# Patient Record
Sex: Female | Born: 1950 | Race: Black or African American | Hispanic: No | Smoking: Current every day smoker
Health system: Southern US, Community
[De-identification: ages and names within clinical notes are randomized; demographics above are authoritative.]

## PROBLEM LIST (undated history)

## (undated) DIAGNOSIS — I10 Essential (primary) hypertension: Secondary | ICD-10-CM

## (undated) DIAGNOSIS — Z8709 Personal history of other diseases of the respiratory system: Secondary | ICD-10-CM

## (undated) DIAGNOSIS — K219 Gastro-esophageal reflux disease without esophagitis: Secondary | ICD-10-CM

## (undated) DIAGNOSIS — Z860100 Personal history of colon polyps, unspecified: Secondary | ICD-10-CM

## (undated) DIAGNOSIS — M542 Cervicalgia: Secondary | ICD-10-CM

## (undated) DIAGNOSIS — Z87448 Personal history of other diseases of urinary system: Secondary | ICD-10-CM

## (undated) DIAGNOSIS — M1990 Unspecified osteoarthritis, unspecified site: Secondary | ICD-10-CM

## (undated) DIAGNOSIS — F419 Anxiety disorder, unspecified: Secondary | ICD-10-CM

## (undated) DIAGNOSIS — R519 Headache, unspecified: Secondary | ICD-10-CM

## (undated) DIAGNOSIS — R350 Frequency of micturition: Secondary | ICD-10-CM

## (undated) DIAGNOSIS — K5790 Diverticulosis of intestine, part unspecified, without perforation or abscess without bleeding: Secondary | ICD-10-CM

## (undated) DIAGNOSIS — Z923 Personal history of irradiation: Secondary | ICD-10-CM

## (undated) DIAGNOSIS — C50919 Malignant neoplasm of unspecified site of unspecified female breast: Secondary | ICD-10-CM

## (undated) DIAGNOSIS — R51 Headache: Secondary | ICD-10-CM

## (undated) HISTORY — PX: OTHER SURGICAL HISTORY: SHX169

## (undated) HISTORY — DX: Unspecified osteoarthritis, unspecified site: M19.90

## (undated) HISTORY — PX: APPENDECTOMY: SHX54

## (undated) HISTORY — PX: TONSILLECTOMY: SUR1361

## (undated) HISTORY — PX: ABDOMINAL HYSTERECTOMY: SHX81

## (undated) HISTORY — DX: Malignant neoplasm of unspecified site of unspecified female breast: C50.919

## (undated) HISTORY — DX: Anxiety disorder, unspecified: F41.9

## (undated) HISTORY — PX: BACK SURGERY: SHX140

## (undated) HISTORY — DX: Essential (primary) hypertension: I10

---
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
## 1999-06-18 ENCOUNTER — Other Ambulatory Visit: Admission: RE | Admit: 1999-06-18 | Discharge: 1999-06-18 | Payer: Self-pay | Admitting: Obstetrics & Gynecology

## 2000-07-21 ENCOUNTER — Other Ambulatory Visit: Admission: RE | Admit: 2000-07-21 | Discharge: 2000-07-21 | Payer: Self-pay | Admitting: Obstetrics & Gynecology

## 2001-09-14 ENCOUNTER — Other Ambulatory Visit: Admission: RE | Admit: 2001-09-14 | Discharge: 2001-09-14 | Payer: Self-pay | Admitting: Obstetrics & Gynecology

## 2002-09-27 ENCOUNTER — Other Ambulatory Visit: Admission: RE | Admit: 2002-09-27 | Discharge: 2002-09-27 | Payer: Self-pay | Admitting: Obstetrics & Gynecology

## 2003-10-19 ENCOUNTER — Other Ambulatory Visit: Admission: RE | Admit: 2003-10-19 | Discharge: 2003-10-19 | Payer: Self-pay | Admitting: Obstetrics & Gynecology

## 2004-10-27 ENCOUNTER — Other Ambulatory Visit: Admission: RE | Admit: 2004-10-27 | Discharge: 2004-10-27 | Payer: Self-pay | Admitting: Obstetrics & Gynecology

## 2009-02-27 ENCOUNTER — Encounter (INDEPENDENT_AMBULATORY_CARE_PROVIDER_SITE_OTHER): Payer: Self-pay | Admitting: *Deleted

## 2009-02-27 ENCOUNTER — Ambulatory Visit (HOSPITAL_BASED_OUTPATIENT_CLINIC_OR_DEPARTMENT_OTHER): Admission: RE | Admit: 2009-02-27 | Discharge: 2009-02-27 | Payer: Self-pay | Admitting: *Deleted

## 2010-07-23 LAB — POCT I-STAT, CHEM 8
BUN: 18 mg/dL (ref 6–23)
Chloride: 105 mEq/L (ref 96–112)
Creatinine, Ser: 1 mg/dL (ref 0.4–1.2)
Glucose, Bld: 76 mg/dL (ref 70–99)
Potassium: 4.2 mEq/L (ref 3.5–5.1)

## 2011-01-06 ENCOUNTER — Emergency Department (HOSPITAL_COMMUNITY)
Admission: EM | Admit: 2011-01-06 | Discharge: 2011-01-06 | Disposition: A | Discharge status: Home / Self Care | Payer: BC Managed Care – PPO | Attending: Emergency Medicine | Admitting: Emergency Medicine

## 2011-01-06 ENCOUNTER — Emergency Department (HOSPITAL_COMMUNITY): Payer: BC Managed Care – PPO

## 2011-01-06 DIAGNOSIS — I10 Essential (primary) hypertension: Secondary | ICD-10-CM | POA: Insufficient documentation

## 2011-01-06 DIAGNOSIS — M542 Cervicalgia: Secondary | ICD-10-CM | POA: Insufficient documentation

## 2011-01-06 DIAGNOSIS — Z79899 Other long term (current) drug therapy: Secondary | ICD-10-CM | POA: Insufficient documentation

## 2011-01-06 DIAGNOSIS — R51 Headache: Secondary | ICD-10-CM | POA: Insufficient documentation

## 2011-01-06 DIAGNOSIS — R5381 Other malaise: Secondary | ICD-10-CM | POA: Insufficient documentation

## 2011-01-06 DIAGNOSIS — R209 Unspecified disturbances of skin sensation: Secondary | ICD-10-CM | POA: Insufficient documentation

## 2011-01-06 LAB — COMPREHENSIVE METABOLIC PANEL
ALT: 15 U/L (ref 0–35)
AST: 19 U/L (ref 0–37)
Albumin: 4.1 g/dL (ref 3.5–5.2)
Alkaline Phosphatase: 47 U/L (ref 39–117)
BUN: 11 mg/dL (ref 6–23)
BUN: 10 mg/dL (ref 6–23)
CO2: 32 mEq/L (ref 19–32)
CO2: 31 mEq/L (ref 19–32)
Calcium: 9.4 mg/dL (ref 8.4–10.5)
Chloride: 106 mEq/L (ref 96–112)
Creatinine, Ser: 0.84 mg/dL (ref 0.50–1.10)
Creatinine, Ser: 0.8 mg/dL (ref 0.50–1.10)
GFR calc Af Amer: >60 mL/min (ref >60)
GFR calc non Af Amer: >60 mL/min (ref >60)
GFR calc non Af Amer: >60 mL/min (ref >60)
Glucose, Bld: 90 mg/dL (ref 70–99)
Potassium: 4.4 mEq/L (ref 3.5–5.1)
Sodium: 143 mEq/L (ref 135–145)
Total Bilirubin: 0.4 mg/dL (ref 0.3–1.2)
Total Protein: 6.6 g/dL (ref 6.0–8.3)

## 2011-01-06 LAB — DIFFERENTIAL
Basophils Absolute: 0 10*3/uL (ref 0.0–0.1)
Basophils Relative: 1 % (ref 0–1)
Lymphocytes Relative: 23 % (ref 12–46)
Lymphs Abs: 2.2 10*3/uL (ref 0.7–4.0)
Lymphs Abs: 2.2 10*3/uL (ref 0.7–4.0)
Monocytes Absolute: 0.3 10*3/uL (ref 0.1–1.0)
Monocytes Relative: 4 % (ref 3–12)
Neutro Abs: 6 10*3/uL (ref 1.7–7.7)
Neutrophils Relative %: 70 % (ref 43–77)
Neutrophils Relative %: 69 % (ref 43–77)

## 2011-01-06 LAB — CBC
HCT: 42 % (ref 36.0–46.0)
HCT: 39.5 % (ref 36.0–46.0)
Hemoglobin: 13.3 g/dL (ref 12.0–15.0)
MCH: 28.1 pg (ref 26.0–34.0)
MCHC: 33.7 g/dL (ref 30.0–36.0)
MCV: 83.2 fL (ref 78.0–100.0)
MCV: 83.3 fL (ref 78.0–100.0)
Platelets: 266 10*3/uL (ref 150–400)
RBC: 5.05 MIL/uL (ref 3.87–5.11)
RBC: 4.74 MIL/uL (ref 3.87–5.11)
WBC: 9.7 10*3/uL (ref 4.0–10.5)

## 2011-01-06 LAB — PROTIME-INR: INR: 0.89 (ref 0.00–1.49)

## 2011-01-06 LAB — CK TOTAL AND CKMB (NOT AT ARMC): CK, MB: 3.8 ng/mL (ref 0.3–4.0)

## 2011-01-06 LAB — APTT: aPTT: 28 seconds (ref 24–37)

## 2011-03-03 ENCOUNTER — Other Ambulatory Visit: Payer: Self-pay | Admitting: Specialist

## 2011-03-03 DIAGNOSIS — M542 Cervicalgia: Secondary | ICD-10-CM

## 2011-03-03 DIAGNOSIS — M5126 Other intervertebral disc displacement, lumbar region: Secondary | ICD-10-CM

## 2011-03-09 ENCOUNTER — Ambulatory Visit
Admission: RE | Admit: 2011-03-09 | Discharge: 2011-03-09 | Disposition: A | Discharge status: Home / Self Care | Payer: BC Managed Care – PPO | Source: Ambulatory Visit | Attending: Specialist | Admitting: Specialist

## 2011-03-09 DIAGNOSIS — M542 Cervicalgia: Secondary | ICD-10-CM

## 2011-03-09 DIAGNOSIS — M5126 Other intervertebral disc displacement, lumbar region: Secondary | ICD-10-CM

## 2011-04-21 DIAGNOSIS — Z87448 Personal history of other diseases of urinary system: Secondary | ICD-10-CM

## 2011-04-21 HISTORY — DX: Personal history of other diseases of urinary system: Z87.448

## 2011-04-22 ENCOUNTER — Encounter (INDEPENDENT_AMBULATORY_CARE_PROVIDER_SITE_OTHER): Payer: BC Managed Care – PPO | Admitting: Ophthalmology

## 2011-04-22 DIAGNOSIS — H43819 Vitreous degeneration, unspecified eye: Secondary | ICD-10-CM

## 2011-04-22 DIAGNOSIS — H2510 Age-related nuclear cataract, unspecified eye: Secondary | ICD-10-CM

## 2011-04-22 DIAGNOSIS — H3530 Unspecified macular degeneration: Secondary | ICD-10-CM

## 2011-09-16 ENCOUNTER — Other Ambulatory Visit: Payer: Self-pay | Admitting: Orthopedic Surgery

## 2011-09-16 DIAGNOSIS — M48061 Spinal stenosis, lumbar region without neurogenic claudication: Secondary | ICD-10-CM

## 2011-09-17 ENCOUNTER — Ambulatory Visit
Admission: RE | Admit: 2011-09-17 | Discharge: 2011-09-17 | Disposition: A | Discharge status: Home / Self Care | Payer: BC Managed Care – PPO | Source: Ambulatory Visit | Attending: Orthopedic Surgery | Admitting: Orthopedic Surgery

## 2011-09-17 VITALS — BP 148/78 | HR 80 | Ht 66.0 in | Wt 143.0 lb

## 2011-09-17 DIAGNOSIS — M48061 Spinal stenosis, lumbar region without neurogenic claudication: Secondary | ICD-10-CM

## 2011-09-17 MED ORDER — DIAZEPAM 5 MG PO TABS
10.0000 mg | ORAL_TABLET | Freq: Once | ORAL | Status: AC
  Administered 2011-09-17: 10 mg via ORAL

## 2011-09-17 MED ORDER — IOHEXOL 180 MG/ML  SOLN
17.0000 mL | Freq: Once | INTRAMUSCULAR | Status: AC | PRN
  Administered 2011-09-17: 17 mL via INTRATHECAL

## 2011-09-17 NOTE — Discharge Instructions (Signed)

## 2011-11-27 ENCOUNTER — Other Ambulatory Visit: Payer: Self-pay | Admitting: Nephrology

## 2011-11-27 DIAGNOSIS — N179 Acute kidney failure, unspecified: Secondary | ICD-10-CM

## 2011-12-01 ENCOUNTER — Ambulatory Visit
Admission: RE | Admit: 2011-12-01 | Discharge: 2011-12-01 | Disposition: A | Discharge status: Home / Self Care | Payer: BC Managed Care – PPO | Source: Ambulatory Visit | Attending: Nephrology | Admitting: Nephrology

## 2011-12-01 DIAGNOSIS — N179 Acute kidney failure, unspecified: Secondary | ICD-10-CM

## 2013-03-27 ENCOUNTER — Encounter (INDEPENDENT_AMBULATORY_CARE_PROVIDER_SITE_OTHER): Payer: BC Managed Care – PPO | Admitting: Ophthalmology

## 2013-03-27 DIAGNOSIS — H43819 Vitreous degeneration, unspecified eye: Secondary | ICD-10-CM

## 2013-03-27 DIAGNOSIS — H3530 Unspecified macular degeneration: Secondary | ICD-10-CM

## 2013-03-27 DIAGNOSIS — I10 Essential (primary) hypertension: Secondary | ICD-10-CM

## 2013-03-27 DIAGNOSIS — H35039 Hypertensive retinopathy, unspecified eye: Secondary | ICD-10-CM

## 2013-03-27 DIAGNOSIS — H2510 Age-related nuclear cataract, unspecified eye: Secondary | ICD-10-CM

## 2014-03-27 ENCOUNTER — Ambulatory Visit (INDEPENDENT_AMBULATORY_CARE_PROVIDER_SITE_OTHER): Payer: BC Managed Care – PPO | Admitting: Ophthalmology

## 2014-04-16 ENCOUNTER — Other Ambulatory Visit: Payer: Self-pay | Admitting: Obstetrics & Gynecology

## 2014-04-17 LAB — CYTOLOGY - PAP

## 2014-10-24 DIAGNOSIS — N183 Chronic kidney disease, stage 3 (moderate): Secondary | ICD-10-CM | POA: Diagnosis not present

## 2014-10-24 DIAGNOSIS — I129 Hypertensive chronic kidney disease with stage 1 through stage 4 chronic kidney disease, or unspecified chronic kidney disease: Secondary | ICD-10-CM | POA: Diagnosis not present

## 2014-10-24 DIAGNOSIS — Z0000 Encounter for general adult medical examination without abnormal findings: Secondary | ICD-10-CM | POA: Diagnosis not present

## 2014-10-24 DIAGNOSIS — M859 Disorder of bone density and structure, unspecified: Secondary | ICD-10-CM | POA: Diagnosis not present

## 2014-10-24 DIAGNOSIS — Z1211 Encounter for screening for malignant neoplasm of colon: Secondary | ICD-10-CM | POA: Diagnosis not present

## 2014-10-24 DIAGNOSIS — K219 Gastro-esophageal reflux disease without esophagitis: Secondary | ICD-10-CM | POA: Diagnosis not present

## 2014-11-01 DIAGNOSIS — H40011 Open angle with borderline findings, low risk, right eye: Secondary | ICD-10-CM | POA: Diagnosis not present

## 2014-11-01 DIAGNOSIS — H1045 Other chronic allergic conjunctivitis: Secondary | ICD-10-CM | POA: Diagnosis not present

## 2014-11-01 DIAGNOSIS — H3531 Nonexudative age-related macular degeneration: Secondary | ICD-10-CM | POA: Diagnosis not present

## 2014-11-01 DIAGNOSIS — H1851 Endothelial corneal dystrophy: Secondary | ICD-10-CM | POA: Diagnosis not present

## 2014-11-01 DIAGNOSIS — H4011X1 Primary open-angle glaucoma, mild stage: Secondary | ICD-10-CM | POA: Diagnosis not present

## 2014-11-01 DIAGNOSIS — H04123 Dry eye syndrome of bilateral lacrimal glands: Secondary | ICD-10-CM | POA: Diagnosis not present

## 2014-11-01 DIAGNOSIS — H11153 Pinguecula, bilateral: Secondary | ICD-10-CM | POA: Diagnosis not present

## 2014-11-01 DIAGNOSIS — H524 Presbyopia: Secondary | ICD-10-CM | POA: Diagnosis not present

## 2014-11-01 DIAGNOSIS — H18413 Arcus senilis, bilateral: Secondary | ICD-10-CM | POA: Diagnosis not present

## 2014-11-01 DIAGNOSIS — H2513 Age-related nuclear cataract, bilateral: Secondary | ICD-10-CM | POA: Diagnosis not present

## 2014-11-01 DIAGNOSIS — H52223 Regular astigmatism, bilateral: Secondary | ICD-10-CM | POA: Diagnosis not present

## 2014-11-01 DIAGNOSIS — H25013 Cortical age-related cataract, bilateral: Secondary | ICD-10-CM | POA: Diagnosis not present

## 2014-11-02 DIAGNOSIS — N179 Acute kidney failure, unspecified: Secondary | ICD-10-CM | POA: Diagnosis not present

## 2014-11-02 DIAGNOSIS — N189 Chronic kidney disease, unspecified: Secondary | ICD-10-CM | POA: Diagnosis not present

## 2014-11-02 DIAGNOSIS — D631 Anemia in chronic kidney disease: Secondary | ICD-10-CM | POA: Diagnosis not present

## 2014-11-08 DIAGNOSIS — Z1211 Encounter for screening for malignant neoplasm of colon: Secondary | ICD-10-CM | POA: Diagnosis not present

## 2014-11-20 DIAGNOSIS — H4011X1 Primary open-angle glaucoma, mild stage: Secondary | ICD-10-CM | POA: Diagnosis not present

## 2015-01-01 DIAGNOSIS — H25013 Cortical age-related cataract, bilateral: Secondary | ICD-10-CM | POA: Diagnosis not present

## 2015-01-01 DIAGNOSIS — H40011 Open angle with borderline findings, low risk, right eye: Secondary | ICD-10-CM | POA: Diagnosis not present

## 2015-01-01 DIAGNOSIS — H1045 Other chronic allergic conjunctivitis: Secondary | ICD-10-CM | POA: Diagnosis not present

## 2015-01-01 DIAGNOSIS — H2513 Age-related nuclear cataract, bilateral: Secondary | ICD-10-CM | POA: Diagnosis not present

## 2015-01-01 DIAGNOSIS — H1851 Endothelial corneal dystrophy: Secondary | ICD-10-CM | POA: Diagnosis not present

## 2015-01-01 DIAGNOSIS — H4011X2 Primary open-angle glaucoma, moderate stage: Secondary | ICD-10-CM | POA: Diagnosis not present

## 2015-01-01 DIAGNOSIS — H11153 Pinguecula, bilateral: Secondary | ICD-10-CM | POA: Diagnosis not present

## 2015-01-01 DIAGNOSIS — H18413 Arcus senilis, bilateral: Secondary | ICD-10-CM | POA: Diagnosis not present

## 2015-01-01 DIAGNOSIS — H04123 Dry eye syndrome of bilateral lacrimal glands: Secondary | ICD-10-CM | POA: Diagnosis not present

## 2015-04-09 DIAGNOSIS — H401221 Low-tension glaucoma, left eye, mild stage: Secondary | ICD-10-CM | POA: Diagnosis not present

## 2015-04-09 DIAGNOSIS — H2513 Age-related nuclear cataract, bilateral: Secondary | ICD-10-CM | POA: Diagnosis not present

## 2015-04-09 DIAGNOSIS — H04123 Dry eye syndrome of bilateral lacrimal glands: Secondary | ICD-10-CM | POA: Diagnosis not present

## 2015-04-09 DIAGNOSIS — H1851 Endothelial corneal dystrophy: Secondary | ICD-10-CM | POA: Diagnosis not present

## 2015-04-09 DIAGNOSIS — H25013 Cortical age-related cataract, bilateral: Secondary | ICD-10-CM | POA: Diagnosis not present

## 2015-04-09 DIAGNOSIS — H11153 Pinguecula, bilateral: Secondary | ICD-10-CM | POA: Diagnosis not present

## 2015-04-09 DIAGNOSIS — H40021 Open angle with borderline findings, high risk, right eye: Secondary | ICD-10-CM | POA: Diagnosis not present

## 2015-04-09 DIAGNOSIS — H11423 Conjunctival edema, bilateral: Secondary | ICD-10-CM | POA: Diagnosis not present

## 2015-04-09 DIAGNOSIS — H18413 Arcus senilis, bilateral: Secondary | ICD-10-CM | POA: Diagnosis not present

## 2015-04-24 DIAGNOSIS — Z01419 Encounter for gynecological examination (general) (routine) without abnormal findings: Secondary | ICD-10-CM | POA: Diagnosis not present

## 2015-04-24 DIAGNOSIS — Z1231 Encounter for screening mammogram for malignant neoplasm of breast: Secondary | ICD-10-CM | POA: Diagnosis not present

## 2015-04-24 DIAGNOSIS — Z6826 Body mass index (BMI) 26.0-26.9, adult: Secondary | ICD-10-CM | POA: Diagnosis not present

## 2015-04-24 DIAGNOSIS — Z124 Encounter for screening for malignant neoplasm of cervix: Secondary | ICD-10-CM | POA: Diagnosis not present

## 2015-04-26 ENCOUNTER — Other Ambulatory Visit: Payer: Self-pay | Admitting: Obstetrics & Gynecology

## 2015-04-26 DIAGNOSIS — R928 Other abnormal and inconclusive findings on diagnostic imaging of breast: Secondary | ICD-10-CM

## 2015-05-03 ENCOUNTER — Ambulatory Visit
Admission: RE | Admit: 2015-05-03 | Discharge: 2015-05-03 | Disposition: A | Discharge status: Home / Self Care | Payer: Medicare Other | Source: Ambulatory Visit | Attending: Obstetrics & Gynecology | Admitting: Obstetrics & Gynecology

## 2015-05-03 ENCOUNTER — Other Ambulatory Visit: Payer: Self-pay | Admitting: Obstetrics & Gynecology

## 2015-05-03 DIAGNOSIS — N63 Unspecified lump in breast: Secondary | ICD-10-CM | POA: Diagnosis not present

## 2015-05-03 DIAGNOSIS — R928 Other abnormal and inconclusive findings on diagnostic imaging of breast: Secondary | ICD-10-CM

## 2015-05-03 DIAGNOSIS — C50511 Malignant neoplasm of lower-outer quadrant of right female breast: Secondary | ICD-10-CM | POA: Diagnosis not present

## 2015-05-08 ENCOUNTER — Telehealth: Payer: Self-pay | Admitting: *Deleted

## 2015-05-08 DIAGNOSIS — C50511 Malignant neoplasm of lower-outer quadrant of right female breast: Secondary | ICD-10-CM

## 2015-05-08 NOTE — Telephone Encounter (Signed)
Left message for a return phone call to schedule for [facility] 1/25.

## 2015-05-08 NOTE — Telephone Encounter (Signed)
Received call back from patient. Confirmed [facility] for 05/15/15 at 830am .  Instructions and contact information given.

## 2015-05-15 ENCOUNTER — Encounter: Payer: Self-pay | Admitting: Physical Therapy

## 2015-05-15 ENCOUNTER — Encounter: Payer: Self-pay | Admitting: Nurse Practitioner

## 2015-05-15 ENCOUNTER — Ambulatory Visit: Payer: Medicare Other | Attending: General Surgery | Admitting: Physical Therapy

## 2015-05-15 ENCOUNTER — Other Ambulatory Visit (HOSPITAL_BASED_OUTPATIENT_CLINIC_OR_DEPARTMENT_OTHER): Payer: Medicare Other

## 2015-05-15 ENCOUNTER — Encounter: Payer: Self-pay | Admitting: Skilled Nursing Facility1

## 2015-05-15 ENCOUNTER — Encounter: Payer: Self-pay | Admitting: Hematology and Oncology

## 2015-05-15 ENCOUNTER — Ambulatory Visit
Admission: RE | Admit: 2015-05-15 | Discharge: 2015-05-15 | Disposition: A | Discharge status: Home / Self Care | Payer: Medicare Other | Source: Ambulatory Visit | Attending: Radiation Oncology | Admitting: Radiation Oncology

## 2015-05-15 ENCOUNTER — Ambulatory Visit (HOSPITAL_BASED_OUTPATIENT_CLINIC_OR_DEPARTMENT_OTHER): Payer: Medicare Other | Admitting: Hematology and Oncology

## 2015-05-15 ENCOUNTER — Other Ambulatory Visit: Payer: Self-pay | Admitting: General Surgery

## 2015-05-15 VITALS — BP 157/75 | HR 89 | Temp 97.9°F | Resp 18 | Ht 66.0 in | Wt 159.2 lb

## 2015-05-15 DIAGNOSIS — C50511 Malignant neoplasm of lower-outer quadrant of right female breast: Secondary | ICD-10-CM

## 2015-05-15 DIAGNOSIS — R293 Abnormal posture: Secondary | ICD-10-CM | POA: Insufficient documentation

## 2015-05-15 DIAGNOSIS — M546 Pain in thoracic spine: Secondary | ICD-10-CM | POA: Diagnosis not present

## 2015-05-15 DIAGNOSIS — M25611 Stiffness of right shoulder, not elsewhere classified: Secondary | ICD-10-CM | POA: Insufficient documentation

## 2015-05-15 LAB — CBC WITH DIFFERENTIAL/PLATELET
BASO%: 0.5 % (ref 0.0–2.0)
BASOS ABS: 0.1 10*3/uL (ref 0.0–0.1)
EOS%: 0.9 % (ref 0.0–7.0)
Eosinophils Absolute: 0.1 10*3/uL (ref 0.0–0.5)
HCT: 44.1 % (ref 34.8–46.6)
HEMOGLOBIN: 14.5 g/dL (ref 11.6–15.9)
LYMPH%: 15.1 % (ref 14.0–49.7)
MCH: 27.8 pg (ref 25.1–34.0)
MCHC: 32.9 g/dL (ref 31.5–36.0)
MCV: 84.4 fL (ref 79.5–101.0)
MONO#: 0.6 10*3/uL (ref 0.1–0.9)
MONO%: 4.7 % (ref 0.0–14.0)
NEUT#: 9.5 10*3/uL — ABNORMAL HIGH (ref 1.5–6.5)
NEUT%: 78.8 % — ABNORMAL HIGH (ref 38.4–76.8)
Platelets: 248 10*3/uL (ref 145–400)
RBC: 5.23 10*6/uL (ref 3.70–5.45)
RDW: 14.4 % (ref 11.2–14.5)
WBC: 12 10*3/uL — ABNORMAL HIGH (ref 3.9–10.3)
lymph#: 1.8 10*3/uL (ref 0.9–3.3)

## 2015-05-15 LAB — COMPREHENSIVE METABOLIC PANEL
ALBUMIN: 4 g/dL (ref 3.5–5.0)
ALT: 18 U/L (ref 0–55)
AST: 19 U/L (ref 5–34)
Alkaline Phosphatase: 60 U/L (ref 40–150)
Anion Gap: 8 mEq/L (ref 3–11)
BUN: 17.7 mg/dL (ref 7.0–26.0)
CHLORIDE: 106 meq/L (ref 98–109)
CO2: 28 meq/L (ref 22–29)
Calcium: 10 mg/dL (ref 8.4–10.4)
Creatinine: 1.3 mg/dL — ABNORMAL HIGH (ref 0.6–1.1)
EGFR: 51 mL/min/{1.73_m2} — ABNORMAL LOW (ref >90)
GLUCOSE: 68 mg/dL — AB (ref 70–140)
POTASSIUM: 4.3 meq/L (ref 3.5–5.1)
SODIUM: 141 meq/L (ref 136–145)
Total Bilirubin: <0.3 mg/dL (ref 0.20–1.20)
Total Protein: 7.7 g/dL (ref 6.4–8.3)

## 2015-05-15 NOTE — Therapy (Signed)
[place], [place], [address] Phone: [phone]   Fax:  [phone]  Physical Therapy Evaluation  Patient Details  Name: DEVONDA PEQUIGNOT MRN: [id number] Date of Birth: 1950-12-25 Referring Provider: Dr. Autumn Messing  Encounter Date: 05/15/2015      PT End of Session - 05/15/15 1322    Visit Number 1   Number of Visits 1   PT Start Time 0086   PT Stop Time 0950  Also saw pt from 1028-1037 and 1135-1148 for a total of 37 minutes   PT Time Calculation (min) 15 min   Activity Tolerance Patient tolerated treatment well   Behavior During Therapy [facility] for tasks assessed/performed      Past Medical History  Diagnosis Date  . Breast cancer ([place])   . Hypertension   . Scoliosis   . Endometriosis   . Anxiety   . Arthritis     Past Surgical History  Procedure Laterality Date  . Back surgery  1971    Harrington Rod Placement  . Appendectomy    . Abdominal hysterectomy    . Tonsillectomy      There were no vitals filed for this visit.  Visit Diagnosis:  Carcinoma of lower outer quadrant of right breast ([place]) - Plan: PT plan of care cert/re-cert  Abnormal posture - Plan: PT plan of care cert/re-cert  Bilateral thoracic back pain - Plan: PT plan of care cert/re-cert  Shoulder stiffness, right - Plan: PT plan of care cert/re-cert      Subjective Assessment - 05/15/15 1258    Subjective Patient was seen today for a baseline assessment of her newly diagnosed right breast cancer.   Pertinent History Patient was diagnosed on 05/03/15 with right grade 1 invasive breast cancer. It measures 1.6 cm located in the lower outer quadrant, is ER/PR positive and HER2 negative.   Patient Stated Goals Reduce lymphedema risk and learn post op shoulder ROM HEP   Currently in Pain? Yes   Pain Score 2    Pain Location Back   Pain Orientation Upper;Mid;Lower   Pain Descriptors / Indicators --  "tension / nerve  pain"   Pain Type Chronic pain   Pain Onset More than a month ago  Pain for many years due to multiple back surgeries and previous Herrington rod   Pain Frequency Intermittent   Aggravating Factors  stress   Pain Relieving Factors unknown   Multiple Pain Sites No            [facility] PT Assessment - 05/15/15 0001    Assessment   Medical Diagnosis Right breast cancer   Referring Provider Dr. Autumn Messing   Onset Date/Surgical Date 05/03/15   Hand Dominance Right   Prior Therapy none   Precautions   Precautions Other (comment)   Precaution Comments Active breast cancer; back surgeries   Restrictions   Weight Bearing Restrictions No   Balance Screen   Has the patient fallen in the past 6 months No   Has the patient had a decrease in activity level because of a fear of falling?  No   Is the patient reluctant to leave their home because of a fear of falling?  No   Home Environment   Living Environment Private residence   Living Arrangements Alone   Available Help at Discharge Family   Prior Function   Level of [place] Retired   Leisure She does not exercise   Cognition  Overall Cognitive Status Within Functional Limits for tasks assessed   Posture/Postural Control   Posture/Postural Control Postural limitations   Postural Limitations Rounded Shoulders;Forward head   ROM / Strength   AROM / PROM / Strength AROM;Strength   AROM   AROM Assessment Site Shoulder   Right/Left Shoulder Right;Left   Right Shoulder Extension 46 Degrees   Right Shoulder Flexion 135 Degrees  With c/o upper back pain   Right Shoulder ABduction 128 Degrees  with c/o upper back pain   Right Shoulder Internal Rotation 68 Degrees   Right Shoulder External Rotation 78 Degrees   Left Shoulder Extension 50 Degrees   Left Shoulder Flexion 152 Degrees   Left Shoulder ABduction 140 Degrees  pt reported this motion strained her back some   Left Shoulder Internal Rotation 65  Degrees   Left Shoulder External Rotation 81 Degrees   Strength   Overall Strength Unable to assess  Did not want to stress her back           LYMPHEDEMA/ONCOLOGY QUESTIONNAIRE - 05/15/15 1320    Type   Cancer Type Right breast cancer   Lymphedema Assessments   Lymphedema Assessments Upper extremities   Right Upper Extremity Lymphedema   10 cm Proximal to Olecranon Process 27.8 cm   Olecranon Process 23.5 cm   10 cm Proximal to Ulnar Styloid Process 21.2 cm   Just Proximal to Ulnar Styloid Process 15.1 cm   Across Hand at [employer] 18.8 cm   At [place] of 2nd Digit 6.3 cm   Left Upper Extremity Lymphedema   10 cm Proximal to Olecranon Process 27.3 cm   Olecranon Process 23.2 cm   10 cm Proximal to Ulnar Styloid Process 20.6 cm   Just Proximal to Ulnar Styloid Process 14.8 cm   Across Hand at [employer] 18.5 cm   At [place] of 2nd Digit 6.1 cm      Patient was instructed today in a home exercise program today for post op shoulder range of motion. These included active assist shoulder flexion in sitting, scapular retraction, wall walking with shoulder abduction, and hands behind head external rotation.  She was encouraged to do these twice a day, holding 3 seconds and repeating 5 times when permitted by her physician.         PT Education - 05/15/15 1322    Education provided Yes   Education Details Post op shoulder ROM HEP and lymphedema risk reduction   Person(s) Educated Patient   Methods Explanation;Demonstration;Handout   Comprehension Verbalized understanding;Returned demonstration              Breast Clinic Goals - 05/15/15 1328    Patient will be able to verbalize understanding of pertinent lymphedema risk reduction practices relevant to her diagnosis specifically related to skin care.   Time 1   Period Days   Status Achieved   Patient will be able to return demonstrate and/or verbalize understanding of the post-op home exercise program related to  regaining shoulder range of motion.   Time 1   Period Days   Status Achieved   Patient will be able to verbalize understanding of the importance of attending the postoperative After Breast Cancer Class for further lymphedema risk reduction education and therapeutic exercise.   Time 1   Period Days   Status Achieved              Plan - 05/15/15 1323    Clinical Impression Statement Patient was diagnosed  on 05/03/15 with right grade 1 invasive breast cancer. It measures 1.6 cm located in the lower outer quadrant, is ER/PR positive and HER2 negative.  Her case was discussed among her medical team in the breast multidisciplinary clinic and her plan recommendations were determined.  She is planning to have a right sentinel node biopsy followed by oncotype testing, radiation and anti-estrogen therapy.  She may benefit from post op PT to regain shoulder ROM and strength and reduce lymphedema risk.   Pt will benefit from skilled therapeutic intervention in order to improve on the following deficits Decreased strength;Decreased knowledge of precautions;Pain;Impaired UE functional use;Decreased range of motion   Rehab Potential Excellent   Clinical Impairments Affecting Rehab Potential Back issues   PT Frequency One time visit   PT Treatment/Interventions Therapeutic exercise;Patient/family education   PT Next Visit Plan Recommended she f/u with PT after surgery   PT Home Exercise Plan Shoulder ROM HEP for post op   Consulted and Agree with Plan of Care Patient     Patient will follow up at outpatient cancer rehab if needed following surgery.  If the patient requires physical therapy at that time, a specific plan will be dictated and sent to the referring physician for approval. The patient was educated today on appropriate basic range of motion exercises to begin post operatively and the importance of attending the After Breast Cancer class following surgery.  Patient was educated today on  lymphedema risk reduction practices as it pertains to recommendations that will benefit the patient immediately following surgery.  She verbalized good understanding.  No additional physical therapy is indicated at this time.         G-Codes - 05-27-15 1328    Functional Assessment Tool Used Clinical Judgement   Functional Limitation Other PT subsequent   Other PT Secondary Current Status (T3646) At least 1 percent but less than 20 percent impaired, limited or restricted   Other PT Secondary Goal Status (O0321) At least 1 percent but less than 20 percent impaired, limited or restricted   Other PT Secondary Discharge Status (Y2482) At least 1 percent but less than 20 percent impaired, limited or restricted       Problem List Patient Active Problem List   Diagnosis Date Noted  . Breast cancer of lower-outer quadrant of right female breast ([place]) 05/08/2015    Annia Friendly, PT May 27, 2015 1:31 PM  [place], [place], [address] Phone: [phone]   Fax:  [phone]  Name: JENIA KLEPPER MRN: [id number] Date of Birth: [birth year]

## 2015-05-15 NOTE — Progress Notes (Signed)
Ms. Karen is a very pleasant 65 y.o. 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  Biopsy results revealed the tumor's prognostic profile is ER positive, PR positive, and HER2/neu negative. Ki67 is 20%.  She presents today with her sister to the [facility]) for treatment consideration and recommendations from the breast surgeon, radiation oncologist, and medical oncologist.     I briefly met with [name] and her sister during her [facility] visit today. We discussed the purpose of the Survivorship Clinic, which will include monitoring for recurrence, coordinating completion of age and gender-appropriate cancer screenings, promotion of overall wellness, as well as managing potential late/long-term side effects of anti-cancer treatments.    The treatment plan for Ms. Karen Roth will likely include surgery, radiation therapy, and anti-estrogen therapy. As of today, the intent of treatment for Ms. Karen Roth is cure, therefore she will be eligible for the Survivorship Clinic upon her completion of treatment.  Her survivorship care plan (SCP) document will be drafted and updated throughout the course of her treatment trajectory. She will receive the SCP in an office visit with myself in the Survivorship Clinic once she has completed treatment.   Ms. Karen Roth was encouraged to ask questions and all questions were answered to her satisfaction.  She was given my business card and encouraged to contact me with any concerns regarding survivorship.  I look forward to participating in her care.   Kenn File, Learned [phone]

## 2015-05-15 NOTE — Progress Notes (Signed)
[facility] CONSULT NOTE  Patient Care Team: Donald Prose, MD as PCP - General (Family Medicine) Autumn Messing III, MD as Consulting Physician (General Surgery) Nicholas Lose, MD as Consulting Physician (Hematology and Oncology) Thea Silversmith, MD as Consulting Physician (Radiation Oncology) Sylvan Cheese, NP as Nurse Practitioner (Hematology and Oncology)  CHIEF COMPLAINTS/PURPOSE OF CONSULTATION:  Newly diagnosed right breast cancer  HISTORY OF PRESENTING ILLNESS:  Karen Roth 65 y.o. female is here because of recent diagnosis of right breast cancer. Patient had a screening mammogram which revealed abnormality. She then had a diagnostic mammogram that revealed architectural distortion in the right breast at 8:30 position in addition to a 9 mm lesion which was not biopsied. The architectural distortion was biopsied. It measured 1.6 x 1.5 x 1.3 cm by ultrasound. The biopsy came back as grade 1 invasive ductal carcinoma with perineural invasion, grade 1 ER/PR positive HER-2 negative with a Ki-67 of 20%. She was presented this morning to the multidisciplinary tumor board and she is here today discuss a treatment plan.  I reviewed her records extensively and collaborated the history with the patient.  SUMMARY OF ONCOLOGIC HISTORY:   Breast cancer of lower-outer quadrant of right female breast ([place])   05/03/2015 Mammogram Right breast Tomo: Distortion at 8:30 position: 1.3 x 1.5 x 1.6 cm by ultrasound, additional mass superficial 9 mm size, low density benign-appearing, T1 cN0 stage IA clinical stage   05/03/2015 Initial Diagnosis Right breast biopsy 8:30 position: Invasive ductal carcinoma with perineural invasion, grade 1, ER 100%, PR 40%, Ki-67 20%, HER-2 negative ratio 1.26    In terms of breast cancer risk profile:  She menarched at early age of 68 and had a hysterectomy at age of 27-40 years  She had 1 pregnancy, her first child was born at age 84  She has not  received birth control pills.  She was hormone replacement therapy. For the past 12 years She has no family history of Breast/GYN/GI cancer  MEDICAL HISTORY:  Past Medical History  Diagnosis Date  . Breast cancer ([place])   . Hypertension   . Scoliosis   . Endometriosis   . Anxiety   . Arthritis     SURGICAL HISTORY: Past Surgical History  Procedure Laterality Date  . Back surgery  1971    Harrington Rod Placement  . Appendectomy    . Abdominal hysterectomy    . Tonsillectomy      SOCIAL HISTORY: Social History   Social History  . Marital Status: Divorced    Spouse Name: N/A  . Number of Children: N/A  . Years of Education: N/A   Occupational History  . Not on file.   Social History Main Topics  . Smoking status: Current Every Day Smoker -- 0.50 packs/day  . Smokeless tobacco: Not on file  . Alcohol Use: Yes  . Drug Use: No  . Sexual Activity: Not on file   Other Topics Concern  . Not on file   Social History Narrative  . No narrative on file    FAMILY HISTORY: Family History  Problem Relation Age of Onset  . Cancer Maternal Aunt     GYN  . Breast cancer Cousin     ALLERGIES:  is allergic to doxycycline; lisinopril; and shellfish allergy.  MEDICATIONS:  Current Outpatient Prescriptions  Medication Sig Dispense Refill  . acetaminophen (TYLENOL) 500 MG tablet Take 500 mg by mouth every 6 (six) hours as needed.    . ALPRAZolam Duanne Moron)  0.5 MG tablet Take by mouth.    Marland Kitchen amLODipine (NORVASC) 5 MG tablet Take by mouth.    Marland Kitchen aspirin EC 81 MG tablet Take by mouth.    . Calcium Citrate-Vitamin D (CITRACAL + D PO) Take by mouth.    . Cholecalciferol (VITAMIN D3) 2000 units capsule Take by mouth.    . cromolyn (OPTICROM) 4 % ophthalmic solution INT 1 GTT INTO OU TWO TO QID  3  . estradiol (ESTRACE) 2 MG tablet Take by mouth.    . furosemide (LASIX) 20 MG tablet Take by mouth.    . Multiple Vitamin (MULTI-VITAMINS) TABS Take by mouth.    . Olopatadine HCl  (PATADAY) 0.2 % SOLN Apply to eye.    . Omega-3 1000 MG CAPS Take by mouth.    Marland Kitchen omeprazole (PRILOSEC) 20 MG capsule Take by mouth.     No current facility-administered medications for this visit.    REVIEW OF SYSTEMS:   Constitutional: Denies fevers, chills or abnormal night sweats Eyes: Denies blurriness of vision, double vision or watery eyes Ears, nose, mouth, throat, and face: Denies mucositis or sore throat Respiratory: Denies cough, dyspnea or wheezes Cardiovascular: Denies palpitation, chest discomfort or lower extremity swelling Gastrointestinal:  Denies nausea, heartburn or change in bowel habits Skin: Denies abnormal skin rashes Lymphatics: Denies new lymphadenopathy or easy bruising Neurological:Denies numbness, tingling or new weaknesses Behavioral/Psych: Mood is stable, no new changes  Breast:  Denies any palpable lumps or discharge All other systems were reviewed with the patient and are negative.  PHYSICAL EXAMINATION: ECOG PERFORMANCE STATUS: 0 - Asymptomatic  Filed Vitals:   05/15/15 0924  BP: 157/75  Pulse: 89  Temp: 97.9 F (36.6 C)  Resp: 18   Filed Weights   05/15/15 0924  Weight: 159 lb 3.2 oz (72.213 kg)    GENERAL:alert, no distress and comfortable SKIN: skin color, texture, turgor are normal, no rashes or significant lesions EYES: normal, conjunctiva are pink and non-injected, sclera clear OROPHARYNX:no exudate, no erythema and lips, buccal mucosa, and tongue normal  NECK: supple, thyroid normal size, non-tender, without nodularity LYMPH:  no palpable lymphadenopathy in the cervical, axillary or inguinal LUNGS: clear to auscultation and percussion with normal breathing effort HEART: regular rate & rhythm and no murmurs and no lower extremity edema ABDOMEN:abdomen soft, non-tender and normal bowel sounds Musculoskeletal:no cyanosis of digits and no clubbing  PSYCH: alert & oriented x 3 with fluent speech NEURO: no focal motor/sensory  deficits BREAST: No palpable nodules in breast. No palpable axillary or supraclavicular lymphadenopathy (exam performed in the presence of a chaperone)   LABORATORY DATA:  I have reviewed the data as listed Lab Results  Component Value Date   WBC 12.0* 05/15/2015   HGB 14.5 05/15/2015   HCT 44.1 05/15/2015   MCV 84.4 05/15/2015   PLT 248 05/15/2015   Lab Results  Component Value Date   NA 141 05/15/2015   K 4.3 05/15/2015   CL 107 01/06/2011   CO2 28 05/15/2015    RADIOGRAPHIC STUDIES: I have personally reviewed the radiological reports and agreed with the findings in the report.  ASSESSMENT AND PLAN:  Breast cancer of lower-outer quadrant of right female breast ([place]) Right breast Tomo: Distortion at 8:30 position: 1.3 x 1.5 x 1.6 cm by ultrasound, additional mass superficial 9 mm size, low density benign-appearing,  Right breast biopsy 8:30 position: Invasive ductal carcinoma with perineural invasion, grade 1, ER 100%, PR 40%, Ki-67 20%, HER-2 negative ratio 1.26 T1  cN0 stage IA clinical stage  Pathology and radiology counseling:Discussed with the patient, the details of pathology including the type of breast cancer,the clinical staging, the significance of ER, PR and HER-2/neu receptors and the implications for treatment. After reviewing the pathology in detail, we proceeded to discuss the different treatment options between surgery, radiation, chemotherapy, antiestrogen therapies.  Recommendations: For the 9 mm additional superficial mass, tumor board recommended breast MRI and consideration for excisional biopsy of the lesion although it appears to be benign. 1. Breast conserving surgery followed by 2. Oncotype DX testing to determine if chemotherapy would be of any benefit followed by 3. Adjuvant radiation therapy followed by 4. Adjuvant antiestrogen therapy  Oncotype counseling: I discussed Oncotype DX test. I explained to the patient that this is a 21 gene panel to  evaluate patient tumors DNA to calculate recurrence score. This would help determine whether patient has high risk or intermediate risk or low risk breast cancer. She understands that if her tumor was found to be high risk, she would benefit from systemic chemotherapy. If low risk, no need of chemotherapy. If she was found to be intermediate risk, we would need to evaluate the score as well as other risk factors and determine if an abbreviated chemotherapy may be of benefit.  Return to clinic after surgery to discuss final pathology report and then determine if Oncotype DX testing will need to be sent.  All questions were answered. The patient knows to call the clinic with any problems, questions or concerns.    Rulon Eisenmenger, MD 05/15/2015

## 2015-05-15 NOTE — Assessment & Plan Note (Signed)
Right breast Tomo: Distortion at 8:30 position: 1.3 x 1.5 x 1.6 cm by ultrasound, additional mass superficial 9 mm size, low density benign-appearing,  Right breast biopsy 8:30 position: Invasive ductal carcinoma with perineural invasion, grade 1, ER 100%, PR 40%, Ki-67 20%, HER-2 negative ratio 1.26 T1 cN0 stage IA clinical stage  Pathology and radiology counseling:Discussed with the patient, the details of pathology including the type of breast cancer,the clinical staging, the significance of ER, PR and HER-2/neu receptors and the implications for treatment. After reviewing the pathology in detail, we proceeded to discuss the different treatment options between surgery, radiation, chemotherapy, antiestrogen therapies.   Recommendations: For the 9 mm additional superficial mass, tumor board recommended breast MRI and consideration for excisional biopsy of the lesion although it appears to be benign. 1. Breast conserving surgery followed by 2. Oncotype DX testing to determine if chemotherapy would be of any benefit followed by 3. Adjuvant radiation therapy followed by 4. Adjuvant antiestrogen therapy  Oncotype counseling: I discussed Oncotype DX test. I explained to the patient that this is a 21 gene panel to evaluate patient tumors DNA to calculate recurrence score. This would help determine whether patient has high risk or intermediate risk or low risk breast cancer. She understands that if her tumor was found to be high risk, she would benefit from systemic chemotherapy. If low risk, no need of chemotherapy. If she was found to be intermediate risk, we would need to evaluate the score as well as other risk factors and determine if an abbreviated chemotherapy may be of benefit.  Return to clinic after surgery to discuss final pathology report and then determine if Oncotype DX testing will need to be sent.

## 2015-05-15 NOTE — Progress Notes (Signed)
Subjective:     Patient ID: Karen Roth, female   DOB: Nov 18, 1950, 65 y.o.   MRN: [id number]  HPI   Review of Systems     Objective:   Physical Exam For the patient to understand and be given the tools to implement a healthy plant based diet during their cancer diagnosis.     Assessment:     Patient was seen today and found to be in good spirits and accompanied by her friend. Pts ht 5'6'', 159 pounds, and BMI 25.7. Pts medications calcium, vitamin d, furosemide, omega 3. Pts labs: glucose 68, creatinine 1.3, GFR 31. Pt states she is drinking a whey shake about every other day at the request from her back surgeon, pt cannot recall the brand of shake it is but states it has 30 grams of protein.    Plan:     Dietitian educated the patient on implementing a plant based diet by incorporating more plant proteins, fruits, and vegetables. As a part of a healthy routine physical activity was discussed. The importance of legitimate, evidence based information was discussed and examples were given. A folder of evidence based information with a focus on a plant based diet and general nutrition during cancer was given to the patient.  As a part of the continuum of care the cancer dietitian's contact information was given to the patient in the event they would like to have a follow up appointment.

## 2015-05-15 NOTE — Patient Instructions (Signed)

## 2015-05-15 NOTE — Progress Notes (Signed)
Radiation Oncology         [phone]) [phone] ________________________________  Initial outpatient Consultation - Date: 05/15/2015   Name: Karen Roth MRN: [id number]   DOB: 09-Jun-1950  REFERRING PHYSICIAN: Donald Prose, MD  DIAGNOSIS AND STAGE: No matching staging information was found for the patient. 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 female who presented for routine screening mammogram on 05/03/2015 and was found to have an abnormality in the right anterior breast. Ultrasound revealed a 1.3x1.5x1.6 cm mixed echogenicity irregular mass in the right breast 8:30 o'clock position. This is felt to correspond to the area of architectural distortion seen on mammogram. Biopsy on 05/03/15 revealed Grade 1 invasive ductal carcinoma and perineural invasion identified of the right breast at the 8:30 o'clock position. This was ER+, PR+, HER-2(-), Ki-67 20%.  Karen Roth is accompanied by her sister-in-law. The patient previously had scoliosis surgery in 1971. She is retired, was previously a Patent examiner. She is okay with discontining her estrogen pills.   PREVIOUS RADIATION THERAPY: No  Past medical, social and family history were reviewed in the electronic chart. Review of symptoms was reviewed in the electronic chart. Medications were reviewed in the electronic chart.   Gynecologic History  Age at first menstrual period? 12-14  Are you still having periods? No Approximate date of last period? Hysterectomy around 14-46 years old  If you are still having periods: Are your periods regular? N/A  If you no longer have periods: Have you used hormone replacement? Yes  If YES, for how long? 10-12 years When did you stop? Still taking Obstetric History:  How many children have you carried to term? 1 Your age at first live birth? 34  Pregnant now or trying to get pregnant? No  Have you  used birth control pills or hormone shots for contraception? No  If so, for how long (or approximate dates)? N/A  Would you be interested in learning more about the options to preserve fertility? N/A Health Maintenance:  Have you ever had a colonoscopy? Yes If yes, date? 01/2010  Have you ever had a bone density? Yes If yes, date? 04/03/13  Date of your last PAP smear? 04/24/2015 Date of your FIRST mammogram? 40's  PHYSICAL EXAM:  Vitals with BMI 05/15/2015  Height _0   Weight 159 lbs 3 oz  BMI 76.2  Systolic 831  Diastolic 75  Pulse 89  Respirations 18    General: Alert and oriented, in no acute distress HEENT: Head is normocephalic. Neck: Neck is supple, no palpable cervical or supraclavicular lymphadenopathy. Breast: Biopsy change in the right breast. No palpable abormalities of the left breast.  Skin: No concerning lesions.  IMPRESSION: Breast cancer of lower-outer quadrant of right female breast [facility])   Staging form: Breast, AJCC 7th Edition     Clinical stage from 05/15/2015: Stage IA (T1c, N0, M0) - Unsigned       Staging comments: Staged at breast conference 1.25.17   PLAN:I spoke to the patient today regarding her diagnosis and options for treatment. We discussed the equivalence in terms of survival and local failure between mastectomy and breast conservation. We discussed the role of radiation in decreasing local failures in patients who undergo lumpectomy. We discussed the process of simulation and the placement tattoos. We discussed 4-6 weeks of treatment as an outpatient. We discussed the possibility of asymptomatic lung damage. We discussed  the low likelihood of secondary malignancies. We discussed the possible side effects including but not limited to skin redness, fatigue, permanent skin darkening, and breast swelling. We discussed the process of simulation and the placement of tattoos. I will see her back after her Oncotype score. I did clarify with her that if she needed  chemotherapy this would be performed prior to radiation. She met with medical oncology as well as a member of our patient family support team and our physical therapist. I will plan on seeing her back after her surgery.  I spent 40 minutes face to face with the patient and more than 50% of that time was spent in counseling and/or coordination of care.   ------------------------------------------------  Thea Silversmith, MD  This document serves as a record of services personally performed by Thea Silversmith, MD. It was created on her behalf by Arlyce Harman, a trained medical scribe. The creation of this record is based on the scribe's personal observations and the provider's statements to them. This document has been checked and approved by the attending provider.

## 2015-05-20 ENCOUNTER — Telehealth: Payer: Self-pay | Admitting: *Deleted

## 2015-05-20 ENCOUNTER — Encounter: Payer: Self-pay | Admitting: *Deleted

## 2015-05-20 NOTE — Telephone Encounter (Signed)
Spoke to pt concerning [place] from 05/15/15. Denies questions or concerns regarding dx or treatment care plan. Scheduled f/u with Dr. Lindi Adie on 3/6 at 71 for post op appt. Encourage pt to call with needs. Received verbal understanding.

## 2015-05-20 NOTE — Progress Notes (Signed)
Clinical Social Work [place] Psychosocial Distress Screening [place]  Patient completed distress screening protocol and scored a 10 on the Psychosocial Distress Thermometer which indicates severe distress. Clinical Social Worker met with patient and patients friend in [facility] to assess for distress and other psychosocial needs. Patient stated she was feeling overwhelmed but felt "better" after meeting with the treatment team and getting more information on her treatment plan. CSW and patient discussed common feeling and emotions when being diagnosed with cancer, and the importance of support during treatment. CSW informed patient of the support team and support services at [facility]. CSW provided contact information and encouraged patient to call with any questions or concerns.   ONCBCN DISTRESS SCREENING 05/20/2015  Screening Type Initial Screening  Distress experienced in past week (1-10) 10  Family Problem type Children;Other (comment)  Emotional problem type Nervousness/Anxiety;Adjusting to illness;Adjusting to appearance changes  Information Concerns Type Lack of info about diagnosis;Lack of info about treatment;Lack of info about complementary therapy choices  Physical Problem type Loss of appetitie;Changes in urination  Physician notified of physical symptoms Yes  Referral to clinical psychology No  Referral to clinical social work Yes  Referral to dietition No  Referral to financial advocate No  Referral to support programs No  Referral to palliative care No   Johnnye Lana, MSW, LCSW, OSW-C Clinical Social Worker [place] [phone]

## 2015-05-22 ENCOUNTER — Other Ambulatory Visit: Payer: Self-pay | Admitting: General Surgery

## 2015-05-22 DIAGNOSIS — C50911 Malignant neoplasm of unspecified site of right female breast: Secondary | ICD-10-CM

## 2015-06-07 ENCOUNTER — Encounter (HOSPITAL_COMMUNITY): Payer: Self-pay

## 2015-06-07 NOTE — Pre-Procedure Instructions (Signed)
Daven M Carre  06/07/2015      [facility] [place], [address] [place] [address] Phone: [phone] Fax: [place] [address] [address] [place] [place] [address] [place] [address] Phone: [phone] Fax: [phone]    Your procedure is scheduled on Mon, Feb 27 @ 10:00 AM  Report to [facility] Admitting at 8:00 AM  Call this number if you have problems the morning of surgery:  [phone]   Remember:  Do not eat food or drink liquids after midnight.  Take these medicines the morning of surgery with A SIP OF WATER Alprazolam(Xanax),Amlodipine(Norvasc),Eye Drops,and Omeprazole(Prilosec)              Stop taking your Fish Oil,Aspirin,along with any Vitamins or Herbal Medications. No Goody's,BC's,Aleve,Advil,Motrin or Ibuprofen.    Do not wear jewelry, make-up or nail polish.  Do not wear lotions, powders, or perfumes.    Do not shave 48 hours prior to surgery.    Do not bring valuables to the hospital.  [facility] is not responsible for any belongings or valuables.  Contacts, dentures or bridgework may not be worn into surgery.  Leave your suitcase in the car.  After surgery it may be brought to your room.  For patients admitted to the hospital, discharge time will be determined by your treatment team.  Patients discharged the day of surgery will not be allowed to drive home.    Special instructions: [place] - Preparing for Surgery  Before surgery, you can play an important role.  Because skin is not sterile, your skin needs to be as free of germs as possible.  You can reduce the number of germs on you skin by washing with CHG (chlorahexidine gluconate) soap before surgery.  CHG is an antiseptic cleaner which kills germs and bonds with the skin to continue killing germs even after washing.  Please DO  NOT use if you have an allergy to CHG or antibacterial soaps.  If your skin becomes reddened/irritated stop using the CHG and inform your nurse when you arrive at Short Stay.  Do not shave (including legs and underarms) for at least 48 hours prior to the first CHG shower.  You may shave your face.  Please follow these instructions carefully:   1.  Shower with CHG Soap the night before surgery and the                                morning of Surgery.  2.  If you choose to wash your hair, wash your hair first as usual with your       normal shampoo.  3.  After you shampoo, rinse your hair and body thoroughly to remove the                      Shampoo.  4.  Use CHG as you would any other liquid soap.  You can apply chg directly       to the skin and wash gently with scrungie or a clean washcloth.  5.  Apply the CHG Soap to your body ONLY FROM THE NECK DOWN.        Do not use on open wounds or open sores.  Avoid contact with  your eyes,       ears, mouth and genitals (private parts).  Wash genitals (private parts)       with your normal soap.  6.  Wash thoroughly, paying special attention to the area where your surgery        will be performed.  7.  Thoroughly rinse your body with warm water from the neck down.  8.  DO NOT shower/wash with your normal soap after using and rinsing off       the CHG Soap.  9.  Pat yourself dry with a clean towel.            10.  Wear clean pajamas.            11.  Place clean sheets on your bed the night of your first shower and do not        sleep with pets.  Day of Surgery  Do not apply any lotions/deoderants the morning of surgery.  Please wear clean clothes to the hospital/surgery center.    Please read over the following fact sheets that you were given. Pain Booklet, Coughing and Deep Breathing and Surgical Site Infection Prevention

## 2015-06-10 ENCOUNTER — Encounter (HOSPITAL_COMMUNITY): Payer: Self-pay

## 2015-06-10 ENCOUNTER — Other Ambulatory Visit: Payer: Self-pay

## 2015-06-10 ENCOUNTER — Encounter (HOSPITAL_COMMUNITY)
Admission: RE | Admit: 2015-06-10 | Discharge: 2015-06-10 | Disposition: A | Discharge status: Home / Self Care | Payer: Medicare Other | Source: Ambulatory Visit | Attending: General Surgery | Admitting: General Surgery

## 2015-06-10 DIAGNOSIS — C50911 Malignant neoplasm of unspecified site of right female breast: Secondary | ICD-10-CM | POA: Diagnosis not present

## 2015-06-10 DIAGNOSIS — R9431 Abnormal electrocardiogram [ECG] [EKG]: Secondary | ICD-10-CM | POA: Insufficient documentation

## 2015-06-10 DIAGNOSIS — Z79899 Other long term (current) drug therapy: Secondary | ICD-10-CM | POA: Diagnosis not present

## 2015-06-10 DIAGNOSIS — Z01818 Encounter for other preprocedural examination: Secondary | ICD-10-CM | POA: Insufficient documentation

## 2015-06-10 DIAGNOSIS — K219 Gastro-esophageal reflux disease without esophagitis: Secondary | ICD-10-CM | POA: Diagnosis not present

## 2015-06-10 DIAGNOSIS — Z7982 Long term (current) use of aspirin: Secondary | ICD-10-CM | POA: Diagnosis not present

## 2015-06-10 DIAGNOSIS — I10 Essential (primary) hypertension: Secondary | ICD-10-CM | POA: Diagnosis not present

## 2015-06-10 DIAGNOSIS — F17200 Nicotine dependence, unspecified, uncomplicated: Secondary | ICD-10-CM | POA: Insufficient documentation

## 2015-06-10 DIAGNOSIS — Z01812 Encounter for preprocedural laboratory examination: Secondary | ICD-10-CM | POA: Insufficient documentation

## 2015-06-10 HISTORY — DX: Personal history of colon polyps, unspecified: Z86.0100

## 2015-06-10 HISTORY — DX: Frequency of micturition: R35.0

## 2015-06-10 HISTORY — DX: Headache: R51

## 2015-06-10 HISTORY — DX: Unspecified osteoarthritis, unspecified site: M19.90

## 2015-06-10 HISTORY — DX: Personal history of other diseases of the respiratory system: Z87.09

## 2015-06-10 HISTORY — DX: Personal history of other diseases of urinary system: Z87.448

## 2015-06-10 HISTORY — DX: Cervicalgia: M54.2

## 2015-06-10 HISTORY — DX: Headache, unspecified: R51.9

## 2015-06-10 HISTORY — DX: Personal history of colonic polyps: Z86.010

## 2015-06-10 HISTORY — DX: Gastro-esophageal reflux disease without esophagitis: K21.9

## 2015-06-10 LAB — CBC
HCT: 45.7 % (ref 36.0–46.0)
Hemoglobin: 15 g/dL (ref 12.0–15.0)
MCH: 27.8 pg (ref 26.0–34.0)
MCHC: 32.8 g/dL (ref 30.0–36.0)
MCV: 84.8 fL (ref 78.0–100.0)
PLATELETS: 248 10*3/uL (ref 150–400)
RBC: 5.39 MIL/uL — ABNORMAL HIGH (ref 3.87–5.11)
RDW: 14.6 % (ref 11.5–15.5)
WBC: 9 10*3/uL (ref 4.0–10.5)

## 2015-06-10 LAB — BASIC METABOLIC PANEL
ANION GAP: 10 (ref 5–15)
BUN: 18 mg/dL (ref 6–20)
CHLORIDE: 103 mmol/L (ref 101–111)
CO2: 26 mmol/L (ref 22–32)
Calcium: 10 mg/dL (ref 8.9–10.3)
Creatinine, Ser: 1.06 mg/dL — ABNORMAL HIGH (ref 0.44–1.00)
GFR, EST NON AFRICAN AMERICAN: 55 mL/min — AB (ref >60)
Glucose, Bld: 98 mg/dL (ref 65–99)
POTASSIUM: 3.6 mmol/L (ref 3.5–5.1)
Sodium: 139 mmol/L (ref 135–145)

## 2015-06-10 MED ORDER — CHLORHEXIDINE GLUCONATE 4 % EX LIQD: 1.0000 "application " | Freq: Once | CUTANEOUS | Status: DC

## 2015-06-10 NOTE — Progress Notes (Addendum)
Cardiologist denies  Medical Md is Dr.Vyvyan Nancy Fetter  Echo denies  Stress test denies  Heart cath denies  EKG denies having one in past yr  CXR denies in past yr

## 2015-06-11 DIAGNOSIS — H40023 Open angle with borderline findings, high risk, bilateral: Secondary | ICD-10-CM | POA: Diagnosis not present

## 2015-06-11 DIAGNOSIS — H18413 Arcus senilis, bilateral: Secondary | ICD-10-CM | POA: Diagnosis not present

## 2015-06-11 DIAGNOSIS — H25013 Cortical age-related cataract, bilateral: Secondary | ICD-10-CM | POA: Diagnosis not present

## 2015-06-11 DIAGNOSIS — H11423 Conjunctival edema, bilateral: Secondary | ICD-10-CM | POA: Diagnosis not present

## 2015-06-11 DIAGNOSIS — H2513 Age-related nuclear cataract, bilateral: Secondary | ICD-10-CM | POA: Diagnosis not present

## 2015-06-11 DIAGNOSIS — H11153 Pinguecula, bilateral: Secondary | ICD-10-CM | POA: Diagnosis not present

## 2015-06-11 NOTE — Progress Notes (Signed)
Anesthesia Chart Review:  Pt is a 65 year old female scheduled for R breast lumpectomy with needle localization and axillary sentinel lymph node biopsy on 06/17/2015 with Dr. Marlou Starks.   PMH includes:  HTN, breast cancer, GERD. Current smoker. BMI 26.5  Medications include: amlodipine, ASA, lasix, prilosec  Preoperative labs reviewed.    EKG 06/10/2015: NSR. Left atrial enlargement. Possible Acute pericarditis  Reviewed case with Dr. Glennon Mac. No CV symptoms documented at PAT.   If no changes, I anticipate pt can proceed with surgery as scheduled.   Willeen Cass, FNP-BC [facility] Short Stay Surgical Center/Anesthesiology Phone: [phone] 06/11/2015 4:14 PM

## 2015-06-14 ENCOUNTER — Other Ambulatory Visit: Payer: Self-pay | Admitting: General Surgery

## 2015-06-14 ENCOUNTER — Ambulatory Visit
Admission: RE | Admit: 2015-06-14 | Discharge: 2015-06-14 | Disposition: A | Discharge status: Home / Self Care | Payer: Medicare Other | Source: Ambulatory Visit | Attending: General Surgery | Admitting: General Surgery

## 2015-06-14 DIAGNOSIS — C50511 Malignant neoplasm of lower-outer quadrant of right female breast: Secondary | ICD-10-CM

## 2015-06-14 DIAGNOSIS — C50911 Malignant neoplasm of unspecified site of right female breast: Secondary | ICD-10-CM | POA: Diagnosis not present

## 2015-06-14 DIAGNOSIS — N63 Unspecified lump in breast: Secondary | ICD-10-CM | POA: Diagnosis not present

## 2015-06-16 MED ORDER — CEFAZOLIN SODIUM-DEXTROSE 2-3 GM-% IV SOLR
2.0000 g | INTRAVENOUS | Status: AC
  Administered 2015-06-17: 2 g via INTRAVENOUS
  Filled 2015-06-16: qty 50

## 2015-06-17 ENCOUNTER — Ambulatory Visit (HOSPITAL_COMMUNITY): Payer: Medicare Other | Admitting: Emergency Medicine

## 2015-06-17 ENCOUNTER — Ambulatory Visit (HOSPITAL_COMMUNITY)
Admission: RE | Admit: 2015-06-17 | Discharge: 2015-06-17 | Disposition: A | Discharge status: Home / Self Care | Payer: Medicare Other | Source: Ambulatory Visit | Attending: General Surgery | Admitting: General Surgery

## 2015-06-17 ENCOUNTER — Encounter (HOSPITAL_COMMUNITY): Payer: Self-pay | Admitting: *Deleted

## 2015-06-17 ENCOUNTER — Ambulatory Visit
Admit: 2015-06-17 | Discharge: 2015-06-17 | Disposition: A | Discharge status: Home / Self Care | Payer: Medicare Other | Attending: General Surgery | Admitting: General Surgery

## 2015-06-17 ENCOUNTER — Encounter (HOSPITAL_COMMUNITY)
Admission: RE | Disposition: A | Discharge status: Home / Self Care | Payer: Self-pay | Source: Ambulatory Visit | Attending: General Surgery

## 2015-06-17 ENCOUNTER — Ambulatory Visit (HOSPITAL_COMMUNITY): Payer: Medicare Other | Admitting: Anesthesiology

## 2015-06-17 DIAGNOSIS — K219 Gastro-esophageal reflux disease without esophagitis: Secondary | ICD-10-CM | POA: Diagnosis not present

## 2015-06-17 DIAGNOSIS — C50511 Malignant neoplasm of lower-outer quadrant of right female breast: Secondary | ICD-10-CM | POA: Diagnosis not present

## 2015-06-17 DIAGNOSIS — G8918 Other acute postprocedural pain: Secondary | ICD-10-CM | POA: Diagnosis not present

## 2015-06-17 DIAGNOSIS — C50911 Malignant neoplasm of unspecified site of right female breast: Secondary | ICD-10-CM | POA: Diagnosis present

## 2015-06-17 DIAGNOSIS — N6011 Diffuse cystic mastopathy of right breast: Secondary | ICD-10-CM | POA: Diagnosis not present

## 2015-06-17 DIAGNOSIS — D0511 Intraductal carcinoma in situ of right breast: Secondary | ICD-10-CM | POA: Diagnosis not present

## 2015-06-17 HISTORY — PX: BREAST LUMPECTOMY: SHX2

## 2015-06-17 HISTORY — PX: BREAST LUMPECTOMY WITH RADIOACTIVE SEED AND SENTINEL LYMPH NODE BIOPSY: SHX6550

## 2015-06-17 SURGERY — BREAST LUMPECTOMY WITH RADIOACTIVE SEED AND SENTINEL LYMPH NODE BIOPSY
Anesthesia: Regional | Site: Breast | Laterality: Right

## 2015-06-17 MED ORDER — DIPHENHYDRAMINE HCL 50 MG/ML IJ SOLN
INTRAMUSCULAR | Status: DC | PRN
  Administered 2015-06-17: 25 mg via INTRAVENOUS

## 2015-06-17 MED ORDER — LACTATED RINGERS IV SOLN
INTRAVENOUS | Status: DC
  Administered 2015-06-17: 10:00:00 via INTRAVENOUS

## 2015-06-17 MED ORDER — ONDANSETRON HCL 4 MG/2ML IJ SOLN
INTRAMUSCULAR | Status: AC
  Filled 2015-06-17: qty 2

## 2015-06-17 MED ORDER — LIDOCAINE HCL (CARDIAC) 20 MG/ML IV SOLN
INTRAVENOUS | Status: DC | PRN
  Administered 2015-06-17: 40 mg via INTRAVENOUS

## 2015-06-17 MED ORDER — BUPIVACAINE-EPINEPHRINE (PF) 0.25% -1:200000 IJ SOLN
INTRAMUSCULAR | Status: AC
  Filled 2015-06-17: qty 30

## 2015-06-17 MED ORDER — DIPHENHYDRAMINE HCL 50 MG/ML IJ SOLN
INTRAMUSCULAR | Status: AC
  Filled 2015-06-17: qty 1

## 2015-06-17 MED ORDER — FENTANYL CITRATE (PF) 250 MCG/5ML IJ SOLN
INTRAMUSCULAR | Status: AC
  Filled 2015-06-17: qty 5

## 2015-06-17 MED ORDER — BUPIVACAINE-EPINEPHRINE (PF) 0.5% -1:200000 IJ SOLN
INTRAMUSCULAR | Status: DC | PRN
  Administered 2015-06-17: 30 mL via PERINEURAL

## 2015-06-17 MED ORDER — MIDAZOLAM HCL 2 MG/2ML IJ SOLN
1.0000 mg | INTRAMUSCULAR | Status: DC | PRN
  Administered 2015-06-17: 2 mg via INTRAVENOUS

## 2015-06-17 MED ORDER — BUPIVACAINE-EPINEPHRINE 0.25% -1:200000 IJ SOLN
INTRAMUSCULAR | Status: DC | PRN
  Administered 2015-06-17: 30 mL

## 2015-06-17 MED ORDER — PROPOFOL 10 MG/ML IV BOLUS
INTRAVENOUS | Status: DC | PRN
  Administered 2015-06-17: 120 mg via INTRAVENOUS

## 2015-06-17 MED ORDER — HYDROMORPHONE HCL 1 MG/ML IJ SOLN
0.2500 mg | INTRAMUSCULAR | Status: DC | PRN
  Administered 2015-06-17: 0.5 mg via INTRAVENOUS

## 2015-06-17 MED ORDER — 0.9 % SODIUM CHLORIDE (POUR BTL) OPTIME
TOPICAL | Status: DC | PRN
  Administered 2015-06-17: 1000 mL

## 2015-06-17 MED ORDER — MIDAZOLAM HCL 2 MG/2ML IJ SOLN
INTRAMUSCULAR | Status: AC
  Administered 2015-06-17: 2 mg via INTRAVENOUS
  Filled 2015-06-17: qty 2

## 2015-06-17 MED ORDER — FENTANYL CITRATE (PF) 100 MCG/2ML IJ SOLN
50.0000 ug | INTRAMUSCULAR | Status: DC | PRN
  Administered 2015-06-17: 100 ug via INTRAVENOUS

## 2015-06-17 MED ORDER — ROCURONIUM BROMIDE 50 MG/5ML IV SOLN
INTRAVENOUS | Status: AC
  Filled 2015-06-17: qty 1

## 2015-06-17 MED ORDER — TECHNETIUM TC 99M SULFUR COLLOID FILTERED: 1.0000 | Freq: Once | INTRAVENOUS | Status: DC | PRN

## 2015-06-17 MED ORDER — FENTANYL CITRATE (PF) 100 MCG/2ML IJ SOLN
INTRAMUSCULAR | Status: AC
  Administered 2015-06-17: 100 ug via INTRAVENOUS
  Filled 2015-06-17: qty 2

## 2015-06-17 MED ORDER — LIDOCAINE HCL (CARDIAC) 20 MG/ML IV SOLN
INTRAVENOUS | Status: AC
  Filled 2015-06-17: qty 5

## 2015-06-17 MED ORDER — OXYCODONE-ACETAMINOPHEN 5-325 MG PO TABS: 1.0000 | ORAL_TABLET | ORAL | Status: DC | PRN

## 2015-06-17 MED ORDER — DEXAMETHASONE SODIUM PHOSPHATE 4 MG/ML IJ SOLN
INTRAMUSCULAR | Status: AC
  Filled 2015-06-17: qty 1

## 2015-06-17 MED ORDER — ONDANSETRON HCL 4 MG/2ML IJ SOLN
INTRAMUSCULAR | Status: DC | PRN
  Administered 2015-06-17: 4 mg via INTRAVENOUS

## 2015-06-17 MED ORDER — FENTANYL CITRATE (PF) 100 MCG/2ML IJ SOLN
INTRAMUSCULAR | Status: DC | PRN
  Administered 2015-06-17: 25 ug via INTRAVENOUS
  Administered 2015-06-17: 50 ug via INTRAVENOUS

## 2015-06-17 MED ORDER — MIDAZOLAM HCL 2 MG/2ML IJ SOLN
INTRAMUSCULAR | Status: AC
  Filled 2015-06-17: qty 2

## 2015-06-17 MED ORDER — HYDROMORPHONE HCL 1 MG/ML IJ SOLN
INTRAMUSCULAR | Status: AC
  Filled 2015-06-17: qty 1

## 2015-06-17 MED ORDER — DEXAMETHASONE SODIUM PHOSPHATE 4 MG/ML IJ SOLN
INTRAMUSCULAR | Status: DC | PRN
  Administered 2015-06-17: 4 mg via INTRAVENOUS

## 2015-06-17 MED ORDER — PROPOFOL 10 MG/ML IV BOLUS
INTRAVENOUS | Status: AC
  Filled 2015-06-17: qty 20

## 2015-06-17 SURGICAL SUPPLY — 46 items
APPLIER CLIP 9.375 MED OPEN (MISCELLANEOUS)
APR CLP MED 9.3 20 MLT OPN (MISCELLANEOUS)
BINDER BREAST LRG (GAUZE/BANDAGES/DRESSINGS) ×3 IMPLANT
BINDER BREAST XLRG (GAUZE/BANDAGES/DRESSINGS) IMPLANT
CANISTER SUCTION 2500CC (MISCELLANEOUS) ×3 IMPLANT
CHLORAPREP W/TINT 26ML (MISCELLANEOUS) ×3 IMPLANT
CLIP APPLIE 9.375 MED OPEN (MISCELLANEOUS) IMPLANT
CONT SPEC 4OZ CLIKSEAL STRL BL (MISCELLANEOUS) ×15 IMPLANT
COVER PROBE W GEL 5X96 (DRAPES) ×3 IMPLANT
COVER SURGICAL LIGHT HANDLE (MISCELLANEOUS) ×3 IMPLANT
DECANTER SPIKE VIAL GLASS SM (MISCELLANEOUS) ×3 IMPLANT
DEVICE DUBIN SPECIMEN MAMMOGRA (MISCELLANEOUS) ×3 IMPLANT
DRAPE CHEST BREAST 15X10 FENES (DRAPES) ×3 IMPLANT
DRAPE UTILITY XL STRL (DRAPES) ×3 IMPLANT
ELECT COATED BLADE 2.86 ST (ELECTRODE) ×3 IMPLANT
ELECT REM PT RETURN 9FT ADLT (ELECTROSURGICAL) ×3
ELECTRODE REM PT RTRN 9FT ADLT (ELECTROSURGICAL) ×1 IMPLANT
GLOVE BIO SURGEON STRL SZ7.5 (GLOVE) ×9 IMPLANT
GLOVE BIOGEL PI IND STRL 7.0 (GLOVE) IMPLANT
GLOVE BIOGEL PI INDICATOR 7.0 (GLOVE) ×4
GLOVE SURG SS PI 7.0 STRL IVOR (GLOVE) ×6 IMPLANT
GOWN STRL REUS W/ TWL LRG LVL3 (GOWN DISPOSABLE) ×2 IMPLANT
GOWN STRL REUS W/TWL LRG LVL3 (GOWN DISPOSABLE) ×6
KIT BASIN OR (CUSTOM PROCEDURE TRAY) ×3 IMPLANT
KIT MARKER MARGIN INK (KITS) IMPLANT
KIT ROOM TURNOVER OR (KITS) ×3 IMPLANT
LIQUID BAND (GAUZE/BANDAGES/DRESSINGS) ×3 IMPLANT
NEEDLE 18GX1X1/2 (RX/OR ONLY) (NEEDLE) IMPLANT
NEEDLE HYPO 25GX1X1/2 BEV (NEEDLE) ×3 IMPLANT
NS IRRIG 1000ML POUR BTL (IV SOLUTION) ×3 IMPLANT
PACK SURGICAL SETUP 50X90 (CUSTOM PROCEDURE TRAY) ×3 IMPLANT
PAD ARMBOARD 7.5X6 YLW CONV (MISCELLANEOUS) ×3 IMPLANT
PENCIL BUTTON HOLSTER BLD 10FT (ELECTRODE) ×3 IMPLANT
SPONGE LAP 18X18 X RAY DECT (DISPOSABLE) ×3 IMPLANT
SUT MNCRL AB 4-0 PS2 18 (SUTURE) ×3 IMPLANT
SUT SILK 2 0 SH (SUTURE) IMPLANT
SUT VIC AB 3-0 54X BRD REEL (SUTURE) ×1 IMPLANT
SUT VIC AB 3-0 BRD 54 (SUTURE) ×3
SUT VIC AB 3-0 SH 18 (SUTURE) ×3 IMPLANT
SYR BULB 3OZ (MISCELLANEOUS) ×3 IMPLANT
SYR CONTROL 10ML LL (SYRINGE) ×3 IMPLANT
TOWEL OR 17X24 6PK STRL BLUE (TOWEL DISPOSABLE) IMPLANT
TOWEL OR 17X26 10 PK STRL BLUE (TOWEL DISPOSABLE) ×3 IMPLANT
TUBE CONNECTING 12'X1/4 (SUCTIONS) ×1
TUBE CONNECTING 12X1/4 (SUCTIONS) ×2 IMPLANT
YANKAUER SUCT BULB TIP NO VENT (SUCTIONS) ×3 IMPLANT

## 2015-06-17 NOTE — Op Note (Signed)
06/17/2015  12:12 PM  PATIENT:  Karen Roth  65 y.o. female  PRE-OPERATIVE DIAGNOSIS:  RIGHT BREAST CANCER  POST-OPERATIVE DIAGNOSIS:  RIGHT BREAST CANCER  PROCEDURE:  Procedure(s): BREAST LUMPECTOMY WITH RADIOACTIVE SEED AND SENTINEL LYMPH NODE BIOPSY (Right)  SURGEON:  Surgeon(s) and Role:    * Jovita Kussmaul, MD - Primary  PHYSICIAN ASSISTANT:   ASSISTANTS: none   ANESTHESIA:   general  EBL:     BLOOD ADMINISTERED:none  DRAINS: none   LOCAL MEDICATIONS USED:  MARCAINE     SPECIMEN:  Source of Specimen:  right breast tissue with additional medial, superior, and inferior margins and sentinel nodes  DISPOSITION OF SPECIMEN:  PATHOLOGY  COUNTS:  YES  TOURNIQUET:  * No tourniquets in log *  DICTATION: .Dragon Dictation   After informed consent was obtained the patient was brought to the operating room and placed in the supine position on the operating room table. After adequate induction of general anesthesia the patient's right chest, breast, and axillary area were prepped with ChloraPrep, allowed to dry, and draped in usual sterile manner. An appropriate timeout was performed. Earlier in the day the patient underwent injection of 1 mCi of technetium sulfur colloid in the subareolar position on the right. Previously an I-125 seed was placed in the lower outer quadrant of the right breast to mark an area of invasive breast cancer. The neoprobe was initially set to technetium in the right axilla was examined. A hot spot was readily identified. A small transversely oriented incision was made overlying the hot spot with a 15 blade knife. The incision was carried through the skin and subcutaneous penis tissue sharply with electrocautery until the axilla was entered. The neoprobe was used to direct blunt hemostat dissection and I was able to identify 2 lymph nodes with radioactivity. These were both excised sharply with the electrocautery and the lymphatics were controlled with  clips. Ex vivo counts on sentinel node #1 were approximately 1000. Ex vivo counts on sentinel node #2 were approximately 200. There were no other hot or palpable lymph nodes identified in the right axilla. The wound was infiltrated with quarter percent Marcaine. The deep layer of the wound was closed with interrupted 3-0 Vicryl stitches. The skin was then closed with a running 4-0 Monocryl subcuticular stitch. Attention was then turned to the right breast. The neoprobe was set to I-125 in the area of the radioactive seed was readily identified in the lower outer quadrant. An elliptical incision was made in the skin overlying the seed with a 15 blade knife. The incision was carried through the skin and subcutaneous tissue sharply with the electrocautery. While checking the area of radioactivity frequently a circular portion of breast tissue was excised sharply around the radioactive seed. Once the specimen was removed it was oriented with the appropriate paint colors. A specimen radiograph showed the clip and seed be within the specimen. I felt like we were close on the medial superior and inferior margins so additional margins were excised sharply in these locations with the electrocautery. These were marked with the appropriate paint colors and sent to pathology as well. The wound was then irrigated with copious amounts of saline. The wound was infiltrated with quarter percent Marcaine. The cavity was marked with clips. The deep layer of the wound was closed with layers of interrupted 3-0 Vicryl stitches. The skin was then closed with interrupted 4-0 Monocryl subcuticular stitches. Dermabond dressings were applied. The patient tolerated the procedure well. At  the end of the case all needle sponge and instrument counts were correct. The patient was then awakened and taken to recovery in stable condition.  PLAN OF CARE: Discharge to home after PACU  PATIENT DISPOSITION:  PACU - hemodynamically stable.   Delay  start of Pharmacological VTE agent (>24hrs) due to surgical blood loss or risk of bleeding: not applicable

## 2015-06-17 NOTE — Anesthesia Procedure Notes (Addendum)
Anesthesia Regional Block:  Pectoralis block  Pre-Anesthetic Checklist: ,, timeout performed, Correct Patient, Correct Site, Correct Laterality, Correct Procedure, Correct Position, site marked, Risks and benefits discussed, pre-op evaluation, post-op pain management  Laterality: Right  Prep: Maximum Sterile Barrier Precautions used and chloraprep       Needles:  Injection technique: Single-shot  Needle Type: Echogenic Stimulator Needle     Needle Length: 10cm 10 cm Needle Gauge: 21 and 21 G    Additional Needles:  Procedures: ultrasound guided (picture in chart) Pectoralis block Narrative:  Start time: 06/17/2015 10:00 AM End time: 06/17/2015 10:10 AM Injection made incrementally with aspirations every 5 mL. Anesthesiologist: Roderic Palau  Additional Notes: 2% Lidocaine skin wheel.   Procedure Name: LMA Insertion Date/Time: 06/17/2015 10:49 AM Performed by: Manus Gunning, Hani Patnode J Pre-anesthesia Checklist: Patient identified, Timeout performed, Emergency Drugs available, Suction available and Patient being monitored Patient Re-evaluated:Patient Re-evaluated prior to inductionOxygen Delivery Method: Circle system utilized Preoxygenation: Pre-oxygenation with 100% oxygen Intubation Type: IV induction Ventilation: Mask ventilation without difficulty LMA: LMA inserted LMA Size: 4.0 Number of attempts: 1 Placement Confirmation: breath sounds checked- equal and bilateral and positive ETCO2 Tube secured with: Tape Dental Injury: Teeth and Oropharynx as per pre-operative assessment

## 2015-06-17 NOTE — Interval H&P Note (Signed)
History and Physical Interval Note:  06/17/2015 9:51 AM  [place]  has presented today for surgery, with the diagnosis of RIGHT BREAST CANCER  The various methods of treatment have been discussed with the patient and family. After consideration of risks, benefits and other options for treatment, the patient has consented to  Procedure(s): RIGHT BREAST LUMPECTOMY WITH seed LOCALIZATION AND AXILLARY SENTINEL LYMPH NODE BX (Right) as a surgical intervention .  The patient's history has been reviewed, patient examined, no change in status, stable for surgery.  I have reviewed the patient's chart and labs.  Questions were answered to the patient's satisfaction.     TOTH III,Mays Paino S

## 2015-06-17 NOTE — H&P (Addendum)
[employer]  Location: [facility] Patient #: [id number] DOB: 1950/10/30 Undefined / Language: Karen Roth / Race: Black or African American Female   History of Present Illness  The patient is a 65 year old female who presents with breast cancer. We are asked to see the patient in consultation by Dr. Pablo Ledger to evaluate her for right breast cancer. The patient is a 65yo bf who presents with a 1.6cm abnormality in the LOQ of the right breast. There was a second more superficial mass 43m that was no seen on u/s and not biopsied. The first was an invasive breast cancer that was Er and Pr +, Her2 -, and Ki67 of 20%. She denies any breast pain or discharge from the nipple. she has had extensive back surgery with rods.   Other Problems  Anxiety Disorder Arthritis Back Pain Breast Cancer Gastroesophageal Reflux Disease Hemorrhoids High blood pressure Lump In Breast Migraine Headache Other disease, cancer, significant illness  Past Surgical History  Appendectomy Breast Biopsy Right. Colon Polyp Removal - Colonoscopy Oral Surgery Spinal Surgery - Lower Back Spinal Surgery Midback Tonsillectomy  Diagnostic Studies History Colonoscopy 5-10 years ago Mammogram within last year Pap Smear 1-5 years ago  Medication History  No Current Medications Medications Reconciled  Social History Alcohol use Moderate alcohol use. Caffeine use Coffee, Tea. No drug use Tobacco use Current some day smoker.  Family History  Alcohol Abuse Brother. Arthritis Brother, Mother. Breast Cancer Family Members In General. Cancer Family Members In General. Cervical Cancer Family Members In General. Diabetes Mellitus Brother, Family Members In General. Heart Disease Brother, Mother. Heart disease in female family member before age 53457Hypertension Brother, Father, Mother, Sister. Ovarian Cancer Family Members In General. Prostate Cancer  Father. Respiratory Condition Father.  Pregnancy / Birth History Age at menarche 89years. Age of menopause <47 Contraceptive History Intrauterine device, Oral contraceptives. Gravida 1 Irregular periods Maternal age 63-35Para 1    Review of Systems  General Not Present- Appetite Loss, Chills, Fatigue, Fever, Night Sweats, Weight Gain and Weight Loss. Skin Present- Dryness. Not Present- Change in Wart/Mole, Hives, Jaundice, New Lesions, Non-Healing Wounds, Rash and Ulcer. HEENT Present- Wears glasses/contact lenses. Not Present- Earache, Hearing Loss, Hoarseness, Nose Bleed, Oral Ulcers, Ringing in the Ears, Seasonal Allergies, Sinus Pain, Sore Throat, Visual Disturbances and Yellow Eyes. Respiratory Not Present- Bloody sputum, Chronic Cough, Difficulty Breathing, Snoring and Wheezing. Breast Not Present- Breast Mass, Breast Pain, Nipple Discharge and Skin Changes. Cardiovascular Present- Leg Cramps. Not Present- Chest Pain, Difficulty Breathing Lying Down, Palpitations, Rapid Heart Rate, Shortness of Breath and Swelling of Extremities. Gastrointestinal Not Present- Abdominal Pain, Bloating, Bloody Stool, Change in Bowel Habits, Chronic diarrhea, Constipation, Difficulty Swallowing, Excessive gas, Gets full quickly at meals, Hemorrhoids, Indigestion, Nausea, Rectal Pain and Vomiting. Female Genitourinary Not Present- Frequency, Nocturia, Painful Urination, Pelvic Pain and Urgency. Musculoskeletal Present- Back Pain, Joint Pain and Joint Stiffness. Not Present- Muscle Pain, Muscle Weakness and Swelling of Extremities. Neurological Not Present- Decreased Memory, Fainting, Headaches, Numbness, Seizures, Tingling, Tremor, Trouble walking and Weakness. Psychiatric Present- Anxiety, Change in Sleep Pattern and Fearful. Not Present- Bipolar, Depression and Frequent crying. Endocrine Not Present- Cold Intolerance, Excessive Hunger, Hair Changes, Heat Intolerance, Hot flashes and New  Diabetes. Hematology Not Present- Easy Bruising, Excessive bleeding, Gland problems, HIV and Persistent Infections.   Physical Exam  General Mental Status-Alert. General Appearance-Consistent with stated age. Hydration-Well hydrated. Voice-Normal.  Head and Neck Head-normocephalic, atraumatic with no lesions or palpable masses. Trachea-midline. Thyroid Gland  Characteristics - normal size and consistency.  Eye Eyeball - Bilateral-Extraocular movements intact. Sclera/Conjunctiva - Bilateral-No scleral icterus.  Chest and Lung Exam Chest and lung exam reveals -quiet, even and easy respiratory effort with no use of accessory muscles and on auscultation, normal breath sounds, no adventitious sounds and normal vocal resonance. Inspection Chest Wall - Normal. Back - normal.  Breast Note: There is no palpable mass in either breast. There is no palpable axillary, supraclavicular, or cervical lymphadenopathy   Cardiovascular Cardiovascular examination reveals -normal heart sounds, regular rate and rhythm with no murmurs and normal pedal pulses bilaterally.  Abdomen Inspection Inspection of the abdomen reveals - No Hernias. Skin - Scar - no surgical scars. Palpation/Percussion Palpation and Percussion of the abdomen reveal - Soft, Non Tender, No Rebound tenderness, No Rigidity (guarding) and No hepatosplenomegaly. Auscultation Auscultation of the abdomen reveals - Bowel sounds normal.  Neurologic Neurologic evaluation reveals -alert and oriented x 3 with no impairment of recent or remote memory. Mental Status-Normal.  Musculoskeletal Normal Exam - Left-Upper Extremity Strength Normal and Lower Extremity Strength Normal. Normal Exam - Right-Upper Extremity Strength Normal and Lower Extremity Strength Normal.  Lymphatic Head & Neck  General Head & Neck Lymphatics: Bilateral - Description - Normal. Axillary  General Axillary Region: Bilateral -  Description - Normal. Tenderness - Non Tender. Femoral & Inguinal  Generalized Femoral & Inguinal Lymphatics: Bilateral - Description - Normal. Tenderness - Non Tender.    Assessment & Plan  BREAST CANCER OF LOWER-OUTER QUADRANT OF RIGHT FEMALE BREAST (C50.511) Impression: The patient appears to have a stage 1 cancer in the lower outer quadrant of the right breast. I have talked to her in detail about the different options for treatment and at this point she favors breast conservation. The radiologists feel that the second area also needs to be removed. On second look the radiologist felt the second area was a simple cyst so this was not localized. I have discussed with her indetail the risks and benefits of the surgery as well as some of the technical aspects and she understands and wishes to proceed. She is also a good candidate for a sentinel node mapping    Signed by Luella Cook, MD

## 2015-06-17 NOTE — Anesthesia Postprocedure Evaluation (Signed)
Anesthesia Post Note  Patient: Dura M Bollard  Procedure(s) Performed: Procedure(s) (LRB): BREAST LUMPECTOMY WITH RADIOACTIVE SEED AND SENTINEL LYMPH NODE BIOPSY (Right)  Patient location during evaluation: PACU Anesthesia Type: General and Regional Level of consciousness: awake and alert Pain management: pain level controlled Vital Signs Assessment: post-procedure vital signs reviewed and stable Respiratory status: spontaneous breathing, nonlabored ventilation, respiratory function stable and patient connected to nasal cannula oxygen Cardiovascular status: blood pressure returned to baseline and stable Postop Assessment: no signs of nausea or vomiting Anesthetic complications: no    Last Vitals:  Filed Vitals:   06/17/15 1300 06/17/15 1315  BP: 130/76 147/88  Pulse: 64 64  Temp:    Resp: 11 14    Last Pain:  Filed Vitals:   06/17/15 1330  PainSc: 4                  Helios Kohlmann,W. EDMOND

## 2015-06-17 NOTE — Transfer of Care (Signed)
Immediate Anesthesia Transfer of Care Note  Patient: Karen Roth  Procedure(s) Performed: Procedure(s): BREAST LUMPECTOMY WITH RADIOACTIVE SEED AND SENTINEL LYMPH NODE BIOPSY (Right)  Patient Location: PACU  Anesthesia Type:General  Level of Consciousness: awake, alert  and oriented  Airway & Oxygen Therapy: Patient Spontanous Breathing  Post-op Assessment: Report given to RN and Post -op Vital signs reviewed and stable  Post vital signs: Reviewed and stable  Last Vitals:  Filed Vitals:   06/17/15 1013 06/17/15 1020  BP: 136/63 126/53  Pulse: 63 61  Temp:    Resp: 11 10    Complications: No apparent anesthesia complications

## 2015-06-17 NOTE — Anesthesia Preprocedure Evaluation (Addendum)
Anesthesia Evaluation  Patient identified by MRN, date of birth, ID band Patient awake    Reviewed: Allergy & Precautions, H&P , NPO status , Patient's Chart, lab work & pertinent test results  Airway Mallampati: II  TM Distance: >3 FB Neck ROM: Full    Dental no notable dental hx. (+) Teeth Intact, Dental Advisory Given   Pulmonary Current Smoker,    Pulmonary exam normal breath sounds clear to auscultation       Cardiovascular hypertension, Pt. on medications  Rhythm:Regular Rate:Normal     Neuro/Psych  Headaches, Anxiety negative psych ROS   GI/Hepatic Neg liver ROS, GERD  Medicated and Controlled,  Endo/Other  negative endocrine ROS  Renal/GU negative Renal ROS  negative genitourinary   Musculoskeletal  (+) Arthritis , Osteoarthritis,    Abdominal   Peds  Hematology negative hematology ROS (+)   Anesthesia Other Findings   Reproductive/Obstetrics negative OB ROS                            Anesthesia Physical Anesthesia Plan  ASA: II  Anesthesia Plan: General and Regional   Post-op Pain Management: GA combined w/ Regional for post-op pain   Induction: Intravenous  Airway Management Planned: LMA  Additional Equipment:   Intra-op Plan:   Post-operative Plan: Extubation in OR  Informed Consent: I have reviewed the patients History and Physical, chart, labs and discussed the procedure including the risks, benefits and alternatives for the proposed anesthesia with the patient or authorized representative who has indicated his/her understanding and acceptance.   Dental advisory given  Plan Discussed with: CRNA  Anesthesia Plan Comments:         Anesthesia Quick Evaluation

## 2015-06-18 ENCOUNTER — Encounter (HOSPITAL_COMMUNITY): Payer: Self-pay | Admitting: General Surgery

## 2015-06-23 NOTE — Assessment & Plan Note (Signed)
Rt Lumpectomy 06/17/15: IDC with DCIS 1.5 cm , 0/3 LN Neg, T1cN0 (Stage 1 A) grade 1, ER 100%, PR 40%, Ki-67 20%, HER-2 negative ratio 1.26, margins Neg  Pathology counseling: I discussed the final pathology report of the patient provided  a copy of this report. I discussed the margins as well as lymph node surgeries. We also discussed the final staging along with previously performed ER/PR and HER-2/neu testing.  Plan: 1. Oncotype DX testing to determine if chemotherapy would be of any benefit followed by 2. Adjuvant radiation therapy followed by 3. Adjuvant antiestrogen therapy

## 2015-06-24 ENCOUNTER — Encounter: Payer: Self-pay | Admitting: Hematology and Oncology

## 2015-06-24 ENCOUNTER — Telehealth: Payer: Self-pay | Admitting: *Deleted

## 2015-06-24 ENCOUNTER — Ambulatory Visit (HOSPITAL_BASED_OUTPATIENT_CLINIC_OR_DEPARTMENT_OTHER): Payer: Medicare Other | Admitting: Hematology and Oncology

## 2015-06-24 VITALS — BP 142/53 | HR 81 | Temp 97.9°F | Resp 18 | Ht 65.0 in | Wt 162.8 lb

## 2015-06-24 DIAGNOSIS — C50511 Malignant neoplasm of lower-outer quadrant of right female breast: Secondary | ICD-10-CM | POA: Diagnosis not present

## 2015-06-24 NOTE — Telephone Encounter (Signed)
Received order per Dr. Lindi Adie for oncotype testing. Requisition sent to pathology. PAC sent to [facility].

## 2015-06-24 NOTE — Progress Notes (Signed)
Patient Care Team: Donald Prose, MD as PCP - General (Family Medicine) Autumn Messing III, MD as Consulting Physician (General Surgery) Nicholas Lose, MD as Consulting Physician (Hematology and Oncology) Thea Silversmith, MD as Consulting Physician (Radiation Oncology) Sylvan Cheese, NP as Nurse Practitioner (Hematology and Oncology)  DIAGNOSIS: Breast cancer of lower-outer quadrant of right female breast [facility])   Staging form: Breast, AJCC 7th Edition     Clinical stage from 05/15/2015: Stage IA (T1c, N0, M0) - Unsigned       Staging comments: Staged at breast conference 1.25.17  SUMMARY OF ONCOLOGIC HISTORY:   Breast cancer of lower-outer quadrant of right female breast ([place])   05/03/2015 Mammogram Right breast Tomo: Distortion at 8:30 position: 1.3 x 1.5 x 1.6 cm by ultrasound, additional mass superficial 9 mm size, low density benign-appearing, T1 cN0 stage IA clinical stage   05/03/2015 Initial Diagnosis Right breast biopsy 8:30 position: Invasive ductal carcinoma with perineural invasion, grade 1, ER 100%, PR 40%, Ki-67 20%, HER-2 negative ratio 1.26   06/17/2015 Surgery Rt Lumpectomy: IDC with DCIS 1.5 cm , 0/3 LN Neg, T1cN0 (Stage 1 A) grade 2, ER 100%, PR 40%, Ki-67 20%, HER-2 negative ratio 1.26, margins Neg    CHIEF COMPLIANT: F/U after lumpectomy  INTERVAL HISTORY: Karen Roth is a 65 yr old with Rt Breast cancer S/P lumpectomy. Patient is here to discuss the pathology report in detail. She reports that she is healing very well except underneath the axilla where she still very sore.  REVIEW OF SYSTEMS:   Constitutional: Denies fevers, chills or abnormal weight loss Eyes: Denies blurriness of vision Ears, nose, mouth, throat, and face: Denies mucositis or sore throat Respiratory: Denies cough, dyspnea or wheezes Cardiovascular: Denies palpitation, chest discomfort Gastrointestinal:  Denies nausea, heartburn or change in bowel habits Skin: Denies abnormal skin  rashes Lymphatics: Denies new lymphadenopathy or easy bruising Neurological:Denies numbness, tingling or new weaknesses Behavioral/Psych: Mood is stable, no new changes  Extremities: No lower extremity edema Breast: Recent right lumpectomy and axillary sentinel node biopsy All other systems were reviewed with the patient and are negative.  I have reviewed the past medical history, past surgical history, social history and family history with the patient and they are unchanged from previous note.  ALLERGIES:  is allergic to doxycycline; lisinopril; and shellfish allergy.  MEDICATIONS:  Current Outpatient Prescriptions  Medication Sig Dispense Refill  . acetaminophen (TYLENOL) 500 MG tablet Take 500-1,000 mg by mouth daily as needed for mild pain or headache.     . ALPRAZolam (XANAX) 0.5 MG tablet Take 0.5 mg by mouth 3 (three) times daily as needed for anxiety.     Marland Kitchen amLODipine (NORVASC) 5 MG tablet Take 5 mg by mouth daily.     . APPLE CIDER VINEGAR PO Take 15 mLs by mouth daily.    Marland Kitchen aspirin EC 81 MG tablet Take 81 mg by mouth daily.     . Calcium Citrate-Vitamin D (CITRACAL + D PO) Take 1 tablet by mouth daily.     . Cholecalciferol (VITAMIN D3) 2000 units capsule Take 2,000 Units by mouth daily.     . cromolyn (OPTICROM) 4 % ophthalmic solution INT 1 GTT INTO OU TWO TO QID  3  . furosemide (LASIX) 20 MG tablet Take by mouth.    . Multiple Vitamin (MULTI-VITAMINS) TABS Take by mouth.    . Omega-3 1000 MG CAPS Take by mouth.    Marland Kitchen omeprazole (PRILOSEC) 20 MG capsule Take 20  mg by mouth daily as needed (for heartburn or acid reflux).     Marland Kitchen oxyCODONE-acetaminophen (ROXICET) 5-325 MG tablet Take 1-2 tablets by mouth every 4 (four) hours as needed. 50 tablet 0  . protein supplement shake (PREMIER PROTEIN) LIQD Take 2 oz by mouth daily as needed (for bone support).     No current facility-administered medications for this visit.    PHYSICAL EXAMINATION: ECOG PERFORMANCE STATUS: 1 -  Symptomatic but completely ambulatory  Filed Vitals:   06/24/15 1119  BP: 142/53  Pulse: 81  Temp: 97.9 F (36.6 C)  Resp: 18   Filed Weights   06/24/15 1119  Weight: 162 lb 12.8 oz (73.846 kg)    GENERAL:alert, no distress and comfortable SKIN: skin color, texture, turgor are normal, no rashes or significant lesions EYES: normal, Conjunctiva are pink and non-injected, sclera clear OROPHARYNX:no exudate, no erythema and lips, buccal mucosa, and tongue normal  NECK: supple, thyroid normal size, non-tender, without nodularity LYMPH:  no palpable lymphadenopathy in the cervical, axillary or inguinal LUNGS: clear to auscultation and percussion with normal breathing effort HEART: regular rate & rhythm and no murmurs and no lower extremity edema ABDOMEN:abdomen soft, non-tender and normal bowel sounds MUSCULOSKELETAL:no cyanosis of digits and no clubbing  NEURO: alert & oriented x 3 with fluent speech, no focal motor/sensory deficits EXTREMITIES: No lower extremity edema  LABORATORY DATA:  I have reviewed the data as listed   Chemistry      Component Value Date/Time   NA 139 06/10/2015 1041   NA 141 05/15/2015 0910   K 3.6 06/10/2015 1041   K 4.3 05/15/2015 0910   CL 103 06/10/2015 1041   CO2 26 06/10/2015 1041   CO2 28 05/15/2015 0910   BUN 18 06/10/2015 1041   BUN 17.7 05/15/2015 0910   CREATININE 1.06* 06/10/2015 1041   CREATININE 1.3* 05/15/2015 0910      Component Value Date/Time   CALCIUM 10.0 06/10/2015 1041   CALCIUM 10.0 05/15/2015 0910   ALKPHOS 60 05/15/2015 0910   ALKPHOS 41 01/06/2011 0904   AST 19 05/15/2015 0910   AST 17 01/06/2011 0904   ALT 18 05/15/2015 0910   ALT 15 01/06/2011 0904   BILITOT <0.30 05/15/2015 0910   BILITOT 0.3 01/06/2011 0904       Lab Results  Component Value Date   WBC 9.0 06/10/2015   HGB 15.0 06/10/2015   HCT 45.7 06/10/2015   MCV 84.8 06/10/2015   PLT 248 06/10/2015   NEUTROABS 9.5* 05/15/2015   ASSESSMENT & PLAN:   Breast cancer of lower-outer quadrant of right female breast ([place]) Rt Lumpectomy 06/17/15: IDC with DCIS 1.5 cm , 0/3 LN Neg, T1cN0 (Stage 1 A) grade 1, ER 100%, PR 40%, Ki-67 20%, HER-2 negative ratio 1.26, margins Neg  Pathology counseling: I discussed the final pathology report of the patient provided  a copy of this report. I discussed the margins as well as lymph node surgeries. We also discussed the final staging along with previously performed ER/PR and HER-2/neu testing.  Plan: 1. Oncotype DX testing to determine if chemotherapy would be of any benefit followed by 2. Adjuvant radiation therapy followed by 3. Adjuvant antiestrogen therapy  No orders of the defined types were placed in this encounter.   The patient has a good understanding of the overall plan. she agrees with it. she will call with any problems that may develop before the next visit here.   Rulon Eisenmenger, MD 06/24/2015

## 2015-06-24 NOTE — Progress Notes (Signed)
Unable to get in to exam room prior to MD.  No assessment performed.  

## 2015-07-08 DIAGNOSIS — C50511 Malignant neoplasm of lower-outer quadrant of right female breast: Secondary | ICD-10-CM | POA: Diagnosis not present

## 2015-07-09 ENCOUNTER — Telehealth: Payer: Self-pay | Admitting: *Deleted

## 2015-07-09 ENCOUNTER — Encounter (HOSPITAL_COMMUNITY): Payer: Self-pay

## 2015-07-09 NOTE — Telephone Encounter (Signed)
Received oncotype score of 19. Per Dr. Lindi Adie pt does not need chemotherapy.  Called pt to give results and that she does not need chemotherapy. Informed she will be receiving call to schedule an appt with Dr. Pablo Ledger for xrt. Denies further needs.

## 2015-07-15 NOTE — Progress Notes (Signed)
Location of Breast Cancer:Lower-outer quadrant right female breast  Histology per Pathology Report: 06-17-15 Diagnosis 1. Breast, lumpectomy, Right - INVASIVE AND IN SITU DUCTAL CARCINOMA, 1.5 CM. - INVASIVE CARCINOMA BROADLY INVOLVES SUPERIOR MARGIN. - DCIS FOCALLY LESS THAN 0.1 CM FROM SUPERIOR MARGIN. - REMAINING MARGINS CLEAR. - FIBROCYSTIC CHANGES WITH CALCIFICATIONS AND USUAL DUCTAL HYPERPLASIA. 2. Lymph node, sentinel, biopsy, Right axillary - ONE BENIGN LYMPH NODE (0/1). 3. Lymph node, sentinel, biopsy, Right axillary - ONE BENIGN LYMPH NODE (0/1). 4. Lymph node, sentinel, biopsy, Right axillary 1 of 4 Diagnosis(continued) - ONE BENIGN LYMPH NODE (0/1). 5. Breast, excision, Additional medial right - FIBROCYSTIC CHANGES WITH CALCIFICATIONS AND FOCAL USUAL DUCTAL HYPERPLASIA, - NO TUMOR IDENTIFIED. - FINAL MARGINS CLEAR. 6. Breast, excision, Additional superior right breast - FOCAL DUCTAL CARCINOMA IN SITU, 0.6 CM FROM FINAL SUPERIOR MARGIN. - NO INVASIVE CARCINOMA IDENTIFIED. - FIBROCYSTIC CHANGES WITH USUAL DUCTAL HYPERPLASIA AND CALCIFICATIONS. 7. Breast, excision, Additional inferior right - FIBROCYSTIC CHANGES WITH CALCIFICATIONS. - NO TUMOR IDENTIFIED. - FINAL MARGIN CLEAR. Microscopic Comment 1. BREAST, INVASIVE TUMOR, WITH LYMPH NODES PRESENT Receptor Status: ER(100%), PR (40%), Her2-neu (-), Ki-(20%)  Did patient present with symptoms (if so, please note symptoms) or was this found on screening mammography?: Karen Roth had a abnormal  3D mammogram 04-24-15.  Past/Anticipated interventions by surgeon, if any:06-17-15 Dr. Marlou Starks F/U 06-27-15 okay for radiation to see in 3 months  Past/Anticipated interventions by medical oncology, if any: ,Oncotype DX testing to determine if chemotherapy would be of any benefit followed by 2. Adjuvant radiation therapy followed by 3. Adjuvant antiestrogen therapy  Lymphedema issues, if any: No  Pain issues, if any:  No Skin:  Right breast healing post surgery denies tenderness. Arm movement: Able to move right arm without discomfort. SAFETY ISSUES:  Prior radiation? :No  Pacemaker/ICD? :No  Possible current pregnancy?: No  Is the patient on methotrexate? : No  Current Complaints / other details: : Here to discuss her radiation plan  [place] age 32, G70,P1, Menopause 47, BC 5 years,HRT 10 years BP 148/68 mmHg  Pulse 71  Temp(Src) 98.6 F (37 C) (Oral)  Resp 18  Ht 5' 5"  (1.651 m)  Wt 162 lb (73.483 kg)  BMI 26.96 kg/m2  SpO2 100%  [name], RN 07/18/2015,9:48 AM    Skin status: Have you seen your surgeon for follow up? Have you seen your medical oncologist? Date If not ,when is appointment? Arm movement: Appetite: Pain: Energy level:

## 2015-07-18 ENCOUNTER — Ambulatory Visit
Admission: RE | Admit: 2015-07-18 | Discharge: 2015-07-18 | Disposition: A | Discharge status: Home / Self Care | Payer: Medicare Other | Source: Ambulatory Visit | Attending: Radiation Oncology | Admitting: Radiation Oncology

## 2015-07-18 ENCOUNTER — Encounter: Payer: Self-pay | Admitting: Radiation Oncology

## 2015-07-18 VITALS — BP 148/68 | HR 71 | Temp 98.6°F | Resp 18 | Ht 65.0 in | Wt 162.0 lb

## 2015-07-18 DIAGNOSIS — Z510 Encounter for antineoplastic radiation therapy: Secondary | ICD-10-CM | POA: Diagnosis not present

## 2015-07-18 DIAGNOSIS — C50511 Malignant neoplasm of lower-outer quadrant of right female breast: Secondary | ICD-10-CM

## 2015-07-18 DIAGNOSIS — Z170 Estrogen receptor positive status [ER+]: Secondary | ICD-10-CM | POA: Diagnosis not present

## 2015-07-18 NOTE — Progress Notes (Signed)
   Department of Radiation Oncology  Phone:  [phone] Fax:        [phone]   Name: Karen Roth MRN: [id number]  DOB: 1950/07/26  Date: 07/18/2015  Follow Up Visit Note  Diagnosis: Breast cancer of lower-outer quadrant of right female breast [facility])   Staging form: Breast, AJCC 7th Edition     Clinical stage from 05/15/2015: Stage IA (T1c, N0, M0) - Unsigned       Staging comments: Staged at breast conference 1.25.17  Prior radiation: No  Interval History: Karen Roth presents today for followup. I initially met with her in multidisciplinary breast clinic on 05/15/2015. She had her lumpectomy on 06/17/2015. This showed a 1.5 cm invasive ductal carcinoma. Margins were negative. Three sentinel lymph nodes were negative. Her2 was negative. It was also ER/PR positive. Her Oncotype was intermediate risk. Dr. Lindi Adie did not feel she would benefit from chemotherapy, so she was referred to me for consideration of radiation.   Today, she denies any lymphedema issues. She denies breast tenderness. She is able to move her right arm with minimal discomfort.   Physical Exam:  Filed Vitals:   07/18/15 0939  BP: 148/68  Pulse: 71  Temp: 98.6 F (37 C)  TempSrc: Oral  Resp: 18  Height: _0  (1.651 m)  Weight: 162 lb (73.483 kg)  SpO2: 100%    This is a well appearing female in no acute distress. She is alert and oriented. Her incision on the lateral aspect of the breast and sentinel node incision are healing well.   IMPRESSION: Karen Roth is a 65 y.o. female with Stage IA (T1c, N0, M0) breast cancer of lower-outer quadrant of right female breast.  PLAN:  I spoke to the patient today regarding her diagnosis and options for treatment. We discussed the equivalence in terms of survival and local failure between mastectomy and breast conservation. We discussed the role of radiation in decreasing local failures in patients who undergo lumpectomy. We discussed the process of simulation and the  placement tattoos. We discussed 4 weeks of treatment as an outpatient. We discussed the possibility of asymptomatic lung damage. We discussed the low likelihood of secondary malignancies. We discussed the possible side effects including but not limited to skin redness, fatigue, permanent skin darkening, and breast swelling.   The patient has signed informed consent and is prepared to proceed with radiation treatment. CT simulation has been scheduled for 07/23/2015 at 9 am.   ------------------------------------------------  Thea Silversmith, MD  This document serves as a record of services personally performed by Thea Silversmith, MD. It was created on her behalf by Jenell Milliner, a trained medical scribe. The creation of this record is based on the scribe's personal observations and the provider's statements to them. This document has been checked and approved by the attending provider.

## 2015-07-19 NOTE — Addendum Note (Signed)
Encounter addended by: Malena Edman, RN on: 07/19/2015  8:53 AM<BR>     Documentation filed: Charges VN

## 2015-07-23 ENCOUNTER — Ambulatory Visit
Admission: RE | Admit: 2015-07-23 | Discharge: 2015-07-23 | Disposition: A | Discharge status: Home / Self Care | Payer: Medicare Other | Source: Ambulatory Visit | Attending: Radiation Oncology | Admitting: Radiation Oncology

## 2015-07-23 ENCOUNTER — Other Ambulatory Visit: Payer: Self-pay | Admitting: *Deleted

## 2015-07-23 DIAGNOSIS — C50511 Malignant neoplasm of lower-outer quadrant of right female breast: Secondary | ICD-10-CM

## 2015-07-23 DIAGNOSIS — Z510 Encounter for antineoplastic radiation therapy: Secondary | ICD-10-CM | POA: Diagnosis not present

## 2015-07-23 DIAGNOSIS — Z170 Estrogen receptor positive status [ER+]: Secondary | ICD-10-CM | POA: Diagnosis not present

## 2015-07-23 NOTE — Progress Notes (Signed)
Name: Karen Roth   MRN: [id number]  Date:  07/23/2015  DOB: [birth year]  Status:outpatient    DIAGNOSIS: Breast cancer.  CONSENT VERIFIED: yes   SET UP: Patient is setup supine   IMMOBILIZATION:  The following immobilization was used:Custom Moldable Pillow, breast board.   NARRATIVE: [name] was brought to the [facility].  Identity was confirmed.  All relevant records and images related to the planned course of therapy were reviewed.  Then, the patient was positioned in a stable reproducible clinical set-up for radiation therapy.  Wires were placed to delineate the clinical extent of breast tissue. A wire was placed on the scar as well.  CT images were obtained.  An isocenter was placed. Skin markings were placed.  The CT images were loaded into the planning software where the target and avoidance structures were contoured.  The radiation prescription was entered and confirmed. The patient was discharged in stable condition and tolerated simulation well.    TREATMENT PLANNING NOTE:  Treatment planning then occurred. I have requested : MLC's, isodose plan, basic dose calculation  I personally designed and supervised the construction of 3 medically necessary complex treatment devices for the protection of critical normal structures including the lungs and contralateral breast as well as the immobilization device which is necessary for set up certainty.   3D simulation occurred. I requested and analyzed a dose volume histogram of the heart, lungs and lumpectomy cavity.

## 2015-07-23 NOTE — Progress Notes (Signed)
Radiation Oncology         [phone]) [phone] ________________________________  Name: Karen Roth      MRN: [id number]          Date: 07/23/2015              DOB: 04/03/1951  Optical Surface Tracking Plan:  Since intensity modulated radiotherapy (IMRT) and 3D conformal radiation treatment methods are predicated on accurate and precise positioning for treatment, intrafraction motion monitoring is medically necessary to ensure accurate and safe treatment delivery.  The ability to quantify intrafraction motion without excessive ionizing radiation dose can only be performed with optical surface tracking. Accordingly, surface imaging offers the opportunity to obtain 3D measurements of patient position throughout IMRT and 3D treatments without excessive radiation exposure.  I am ordering optical surface tracking for this patient's upcoming course of radiotherapy. ________________________________ Signature   Reference:   Ursula Alert, J, et al. Surface imaging-based analysis of intrafraction motion for breast radiotherapy patients.Journal of [place], n. 6, nov. 2014. ISSN [id number].   Available at: <http://www.jacmp.org/index.php/jacmp/article/view/4957>.

## 2015-07-24 ENCOUNTER — Telehealth: Payer: Self-pay | Admitting: Hematology and Oncology

## 2015-07-24 NOTE — Telephone Encounter (Signed)
Left vm to inform patient of 5/19 appt per 4/4 pof

## 2015-07-25 DIAGNOSIS — Z510 Encounter for antineoplastic radiation therapy: Secondary | ICD-10-CM | POA: Diagnosis not present

## 2015-07-25 DIAGNOSIS — Z170 Estrogen receptor positive status [ER+]: Secondary | ICD-10-CM | POA: Diagnosis not present

## 2015-07-25 DIAGNOSIS — C50511 Malignant neoplasm of lower-outer quadrant of right female breast: Secondary | ICD-10-CM | POA: Diagnosis not present

## 2015-07-30 ENCOUNTER — Ambulatory Visit
Admission: RE | Admit: 2015-07-30 | Discharge: 2015-07-30 | Disposition: A | Discharge status: Home / Self Care | Payer: Medicare Other | Source: Ambulatory Visit | Attending: Radiation Oncology | Admitting: Radiation Oncology

## 2015-07-30 DIAGNOSIS — C50511 Malignant neoplasm of lower-outer quadrant of right female breast: Secondary | ICD-10-CM | POA: Diagnosis not present

## 2015-07-30 DIAGNOSIS — Z170 Estrogen receptor positive status [ER+]: Secondary | ICD-10-CM | POA: Diagnosis not present

## 2015-07-30 DIAGNOSIS — Z510 Encounter for antineoplastic radiation therapy: Secondary | ICD-10-CM | POA: Diagnosis not present

## 2015-07-31 ENCOUNTER — Ambulatory Visit
Admission: RE | Admit: 2015-07-31 | Discharge: 2015-07-31 | Disposition: A | Discharge status: Home / Self Care | Payer: Medicare Other | Source: Ambulatory Visit | Attending: Radiation Oncology | Admitting: Radiation Oncology

## 2015-07-31 DIAGNOSIS — Z510 Encounter for antineoplastic radiation therapy: Secondary | ICD-10-CM | POA: Diagnosis not present

## 2015-07-31 DIAGNOSIS — C50511 Malignant neoplasm of lower-outer quadrant of right female breast: Secondary | ICD-10-CM | POA: Diagnosis not present

## 2015-07-31 DIAGNOSIS — Z170 Estrogen receptor positive status [ER+]: Secondary | ICD-10-CM | POA: Diagnosis not present

## 2015-08-01 ENCOUNTER — Ambulatory Visit
Admission: RE | Admit: 2015-08-01 | Discharge: 2015-08-01 | Disposition: A | Discharge status: Home / Self Care | Payer: Medicare Other | Source: Ambulatory Visit | Attending: Radiation Oncology | Admitting: Radiation Oncology

## 2015-08-01 DIAGNOSIS — C50511 Malignant neoplasm of lower-outer quadrant of right female breast: Secondary | ICD-10-CM | POA: Diagnosis not present

## 2015-08-01 DIAGNOSIS — Z170 Estrogen receptor positive status [ER+]: Secondary | ICD-10-CM | POA: Diagnosis not present

## 2015-08-01 DIAGNOSIS — Z510 Encounter for antineoplastic radiation therapy: Secondary | ICD-10-CM | POA: Diagnosis not present

## 2015-08-02 ENCOUNTER — Ambulatory Visit
Admission: RE | Admit: 2015-08-02 | Discharge: 2015-08-02 | Disposition: A | Discharge status: Home / Self Care | Payer: Medicare Other | Source: Ambulatory Visit | Attending: Radiation Oncology | Admitting: Radiation Oncology

## 2015-08-02 DIAGNOSIS — Z170 Estrogen receptor positive status [ER+]: Secondary | ICD-10-CM | POA: Diagnosis not present

## 2015-08-02 DIAGNOSIS — C50511 Malignant neoplasm of lower-outer quadrant of right female breast: Secondary | ICD-10-CM | POA: Diagnosis not present

## 2015-08-02 DIAGNOSIS — Z510 Encounter for antineoplastic radiation therapy: Secondary | ICD-10-CM | POA: Diagnosis not present

## 2015-08-05 ENCOUNTER — Ambulatory Visit
Admission: RE | Admit: 2015-08-05 | Discharge: 2015-08-05 | Disposition: A | Discharge status: Home / Self Care | Payer: Medicare Other | Source: Ambulatory Visit | Attending: Radiation Oncology | Admitting: Radiation Oncology

## 2015-08-05 DIAGNOSIS — Z170 Estrogen receptor positive status [ER+]: Secondary | ICD-10-CM | POA: Diagnosis not present

## 2015-08-05 DIAGNOSIS — C50511 Malignant neoplasm of lower-outer quadrant of right female breast: Secondary | ICD-10-CM | POA: Diagnosis not present

## 2015-08-05 DIAGNOSIS — Z510 Encounter for antineoplastic radiation therapy: Secondary | ICD-10-CM | POA: Diagnosis not present

## 2015-08-06 ENCOUNTER — Ambulatory Visit
Admission: RE | Admit: 2015-08-06 | Discharge: 2015-08-06 | Disposition: A | Discharge status: Home / Self Care | Payer: Medicare Other | Source: Ambulatory Visit | Attending: Radiation Oncology | Admitting: Radiation Oncology

## 2015-08-06 ENCOUNTER — Encounter: Payer: Self-pay | Admitting: Radiation Oncology

## 2015-08-06 VITALS — BP 157/79 | HR 70 | Temp 98.6°F | Ht 65.0 in | Wt 162.0 lb

## 2015-08-06 DIAGNOSIS — C50511 Malignant neoplasm of lower-outer quadrant of right female breast: Secondary | ICD-10-CM

## 2015-08-06 DIAGNOSIS — Z510 Encounter for antineoplastic radiation therapy: Secondary | ICD-10-CM | POA: Diagnosis not present

## 2015-08-06 DIAGNOSIS — Z170 Estrogen receptor positive status [ER+]: Secondary | ICD-10-CM | POA: Diagnosis not present

## 2015-08-06 DIAGNOSIS — Z923 Personal history of irradiation: Secondary | ICD-10-CM | POA: Insufficient documentation

## 2015-08-06 DIAGNOSIS — C50911 Malignant neoplasm of unspecified site of right female breast: Secondary | ICD-10-CM | POA: Insufficient documentation

## 2015-08-06 MED ORDER — RADIAPLEXRX EX GEL
Freq: Once | CUTANEOUS | Status: AC
  Administered 2015-08-06: 18:00:00 via TOPICAL

## 2015-08-06 MED ORDER — ALRA NON-METALLIC DEODORANT (RAD-ONC)
1.0000 "application " | Freq: Once | TOPICAL | Status: AC
  Administered 2015-08-06: 1 via TOPICAL

## 2015-08-06 NOTE — Addendum Note (Signed)
Encounter addended by: Malena Edman, RN on: 08/06/2015  6:12 PM<BR>     Documentation filed: Inpatient MAR

## 2015-08-06 NOTE — Progress Notes (Signed)
Karen Roth has received 5 fractions to her right breast.  Skin to the right breast field has no skin change, using Radiaplex gel bid.  Appetite is normal.  Energy level has not changed.  Denies having pain.

## 2015-08-06 NOTE — Progress Notes (Signed)
Karen Roth has received 5 fractions to her right breast. Skin to the right breast field has no skin change, using Radiaplex gel bid. Appetite is normal. Energy level has not changed. Denies having pain.  Pt here for patient teaching.  Pt given Radiation and You booklet, skin care instructions, Alra deodorant and Radiaplex gel. Pt reports they have watched, not watched and refused to watch the Radiation Therapy Education video on August 06, 2015.  Reviewed areas of pertinence such as fatigue, hair loss, skin changes, breast tenderness and breast swelling . Pt able to give teach back of to pat skin, use unscented/gentle soap and drink plenty of water,apply Radiaplex bid, avoid applying anything to skin within 4 hours of treatment, avoid wearing an under wire bra and to use an electric razor if they must shave. Pt demonstrated understanding of information given and will contact nursing with any questions or concerns.     Http://rtanswers.org/treatmentinformation/whattoexpect/index

## 2015-08-06 NOTE — Progress Notes (Signed)
Weekly Management Note Current Dose: 13.35  Gy  Projected Dose:42.72  Gy   Narrative:  The patient presents for routine under treatment assessment.  CBCT/MVCT images/Port film x-rays were reviewed.  The chart was checked. Doing well. No complaints. RN education performed.   Physical Findings: Weight: 162 lb (73.483 kg). Unchanged  Impression:  The patient is tolerating radiation.  Plan:  Continue treatment as planned. Start Radiaplex

## 2015-08-06 NOTE — Addendum Note (Signed)
Encounter addended by: Malena Edman, RN on: 08/06/2015  5:51 PM<BR>     Documentation filed: [employer], Inpatient Patient Education, Orders

## 2015-08-07 ENCOUNTER — Ambulatory Visit
Admission: RE | Admit: 2015-08-07 | Discharge: 2015-08-07 | Disposition: A | Discharge status: Home / Self Care | Payer: Medicare Other | Source: Ambulatory Visit | Attending: Radiation Oncology | Admitting: Radiation Oncology

## 2015-08-07 DIAGNOSIS — Z170 Estrogen receptor positive status [ER+]: Secondary | ICD-10-CM | POA: Diagnosis not present

## 2015-08-07 DIAGNOSIS — C50511 Malignant neoplasm of lower-outer quadrant of right female breast: Secondary | ICD-10-CM | POA: Diagnosis not present

## 2015-08-07 DIAGNOSIS — Z510 Encounter for antineoplastic radiation therapy: Secondary | ICD-10-CM | POA: Diagnosis not present

## 2015-08-08 ENCOUNTER — Ambulatory Visit
Admission: RE | Admit: 2015-08-08 | Discharge: 2015-08-08 | Disposition: A | Discharge status: Home / Self Care | Payer: Medicare Other | Source: Ambulatory Visit | Attending: Radiation Oncology | Admitting: Radiation Oncology

## 2015-08-08 ENCOUNTER — Telehealth: Payer: Self-pay | Admitting: *Deleted

## 2015-08-08 DIAGNOSIS — Z510 Encounter for antineoplastic radiation therapy: Secondary | ICD-10-CM | POA: Diagnosis not present

## 2015-08-08 DIAGNOSIS — C50511 Malignant neoplasm of lower-outer quadrant of right female breast: Secondary | ICD-10-CM | POA: Diagnosis not present

## 2015-08-08 DIAGNOSIS — Z170 Estrogen receptor positive status [ER+]: Secondary | ICD-10-CM | POA: Diagnosis not present

## 2015-08-08 NOTE — Telephone Encounter (Signed)
Spoke with patient to follow up after starting radiation.  She states she is doing well.  Encouraged her to call with any needs or concerns.   

## 2015-08-09 ENCOUNTER — Ambulatory Visit
Admission: RE | Admit: 2015-08-09 | Discharge: 2015-08-09 | Disposition: A | Discharge status: Home / Self Care | Payer: Medicare Other | Source: Ambulatory Visit | Attending: Radiation Oncology | Admitting: Radiation Oncology

## 2015-08-09 DIAGNOSIS — C50511 Malignant neoplasm of lower-outer quadrant of right female breast: Secondary | ICD-10-CM | POA: Diagnosis not present

## 2015-08-09 DIAGNOSIS — Z510 Encounter for antineoplastic radiation therapy: Secondary | ICD-10-CM | POA: Diagnosis not present

## 2015-08-09 DIAGNOSIS — Z170 Estrogen receptor positive status [ER+]: Secondary | ICD-10-CM | POA: Diagnosis not present

## 2015-08-12 ENCOUNTER — Ambulatory Visit
Admission: RE | Admit: 2015-08-12 | Discharge: 2015-08-12 | Disposition: A | Discharge status: Home / Self Care | Payer: Medicare Other | Source: Ambulatory Visit | Attending: Radiation Oncology | Admitting: Radiation Oncology

## 2015-08-12 DIAGNOSIS — Z510 Encounter for antineoplastic radiation therapy: Secondary | ICD-10-CM | POA: Diagnosis not present

## 2015-08-12 DIAGNOSIS — Z170 Estrogen receptor positive status [ER+]: Secondary | ICD-10-CM | POA: Diagnosis not present

## 2015-08-12 DIAGNOSIS — C50511 Malignant neoplasm of lower-outer quadrant of right female breast: Secondary | ICD-10-CM | POA: Diagnosis not present

## 2015-08-13 ENCOUNTER — Encounter: Payer: Self-pay | Admitting: Radiation Oncology

## 2015-08-13 ENCOUNTER — Ambulatory Visit
Admission: RE | Admit: 2015-08-13 | Discharge: 2015-08-13 | Disposition: A | Discharge status: Home / Self Care | Payer: Medicare Other | Source: Ambulatory Visit | Attending: Radiation Oncology | Admitting: Radiation Oncology

## 2015-08-13 VITALS — BP 140/75 | HR 77 | Temp 98.1°F | Resp 16 | Ht 65.0 in | Wt 161.4 lb

## 2015-08-13 DIAGNOSIS — Z170 Estrogen receptor positive status [ER+]: Secondary | ICD-10-CM | POA: Diagnosis not present

## 2015-08-13 DIAGNOSIS — C50511 Malignant neoplasm of lower-outer quadrant of right female breast: Secondary | ICD-10-CM

## 2015-08-13 DIAGNOSIS — Z510 Encounter for antineoplastic radiation therapy: Secondary | ICD-10-CM | POA: Diagnosis not present

## 2015-08-13 NOTE — Progress Notes (Signed)
Weekly Management Note Current Dose: 26.7  Gy  Projected Dose:42.72  Gy   Narrative:  The patient presents for routine under treatment assessment.  CBCT/MVCT images/Port film x-rays were reviewed.  The chart was checked. Doing well. No complaints. Wondered about left hand being darker.    Physical Findings: Weight: 161 lb 6.4 oz (73.211 kg). Unchanged  Impression:  The patient is tolerating radiation.  Plan:  Continue treatment as planned.  continue Radiaplex. Discussed hand was not in RT field films. Patient wishes to move hand down on waist which is fine.

## 2015-08-13 NOTE — Progress Notes (Signed)
Karen Roth has received 10 fractions to her right breast.  Skin to right breast tanning using Radiaplex gel bid. Has some tenderness to right breast.  Appetite is good having some indigestion at times.  Having some fatigue at night. BP 140/75 mmHg  Pulse 77  Temp(Src) 98.1 F (36.7 C) (Oral)  Resp 16  Ht 5\' 5"  (1.651 m)  Wt 161 lb 6.4 oz (73.211 kg)  BMI 26.86 kg/m2  SpO2 99%

## 2015-08-14 ENCOUNTER — Ambulatory Visit
Admission: RE | Admit: 2015-08-14 | Discharge: 2015-08-14 | Disposition: A | Discharge status: Home / Self Care | Payer: Medicare Other | Source: Ambulatory Visit | Attending: Radiation Oncology | Admitting: Radiation Oncology

## 2015-08-14 DIAGNOSIS — C50511 Malignant neoplasm of lower-outer quadrant of right female breast: Secondary | ICD-10-CM | POA: Diagnosis not present

## 2015-08-14 DIAGNOSIS — Z170 Estrogen receptor positive status [ER+]: Secondary | ICD-10-CM | POA: Diagnosis not present

## 2015-08-14 DIAGNOSIS — Z510 Encounter for antineoplastic radiation therapy: Secondary | ICD-10-CM | POA: Diagnosis not present

## 2015-08-15 ENCOUNTER — Ambulatory Visit
Admission: RE | Admit: 2015-08-15 | Discharge: 2015-08-15 | Disposition: A | Discharge status: Home / Self Care | Payer: Medicare Other | Source: Ambulatory Visit | Attending: Radiation Oncology | Admitting: Radiation Oncology

## 2015-08-15 DIAGNOSIS — C50511 Malignant neoplasm of lower-outer quadrant of right female breast: Secondary | ICD-10-CM | POA: Diagnosis not present

## 2015-08-15 DIAGNOSIS — Z510 Encounter for antineoplastic radiation therapy: Secondary | ICD-10-CM | POA: Diagnosis not present

## 2015-08-15 DIAGNOSIS — Z170 Estrogen receptor positive status [ER+]: Secondary | ICD-10-CM | POA: Diagnosis not present

## 2015-08-16 ENCOUNTER — Ambulatory Visit
Admission: RE | Admit: 2015-08-16 | Discharge: 2015-08-16 | Disposition: A | Discharge status: Home / Self Care | Payer: Medicare Other | Source: Ambulatory Visit | Attending: Radiation Oncology | Admitting: Radiation Oncology

## 2015-08-16 DIAGNOSIS — Z510 Encounter for antineoplastic radiation therapy: Secondary | ICD-10-CM | POA: Diagnosis not present

## 2015-08-16 DIAGNOSIS — Z170 Estrogen receptor positive status [ER+]: Secondary | ICD-10-CM | POA: Diagnosis not present

## 2015-08-16 DIAGNOSIS — C50511 Malignant neoplasm of lower-outer quadrant of right female breast: Secondary | ICD-10-CM | POA: Diagnosis not present

## 2015-08-19 ENCOUNTER — Ambulatory Visit
Admission: RE | Admit: 2015-08-19 | Discharge: 2015-08-19 | Disposition: A | Discharge status: Home / Self Care | Payer: Medicare Other | Source: Ambulatory Visit | Attending: Radiation Oncology | Admitting: Radiation Oncology

## 2015-08-19 DIAGNOSIS — Z170 Estrogen receptor positive status [ER+]: Secondary | ICD-10-CM | POA: Diagnosis not present

## 2015-08-19 DIAGNOSIS — Z510 Encounter for antineoplastic radiation therapy: Secondary | ICD-10-CM | POA: Diagnosis not present

## 2015-08-19 DIAGNOSIS — C50511 Malignant neoplasm of lower-outer quadrant of right female breast: Secondary | ICD-10-CM | POA: Diagnosis not present

## 2015-08-20 ENCOUNTER — Ambulatory Visit
Admission: RE | Admit: 2015-08-20 | Discharge: 2015-08-20 | Disposition: A | Discharge status: Home / Self Care | Payer: Medicare Other | Source: Ambulatory Visit | Attending: Radiation Oncology | Admitting: Radiation Oncology

## 2015-08-20 ENCOUNTER — Encounter: Payer: Self-pay | Admitting: Radiation Oncology

## 2015-08-20 VITALS — BP 146/84 | HR 68 | Temp 98.3°F | Ht 65.0 in | Wt 161.9 lb

## 2015-08-20 DIAGNOSIS — Z510 Encounter for antineoplastic radiation therapy: Secondary | ICD-10-CM | POA: Diagnosis not present

## 2015-08-20 DIAGNOSIS — C50511 Malignant neoplasm of lower-outer quadrant of right female breast: Secondary | ICD-10-CM

## 2015-08-20 DIAGNOSIS — Z170 Estrogen receptor positive status [ER+]: Secondary | ICD-10-CM | POA: Diagnosis not present

## 2015-08-20 NOTE — Progress Notes (Signed)
Weekly Management Note Current Dose: 40.05  Gy  Projected Dose:42.72  Gy   Narrative:  The patient presents for routine under treatment assessment.  CBCT/MVCT images/Port film x-rays were reviewed.  The chart was checked.  She finishes treatment and is feeling well.    Physical Findings: Weight: 161 lb 14.4 oz (73.437 kg). Her skin is dark with no moist desquamation.   Impression:  The patient is tolerating radiation.  Plan:  Continue treatment as planned.  Continue Radiaplex.  She meets with Dr. Lindi Adie on May 19th. I will see her for follow up in 1 month. Skin care and survivorship have been discussed.   ------------------------------------------------  Thea Silversmith, MD  This document serves as a record of services personally performed by Thea Silversmith, MD. It was created on her behalf by Arlyce Harman, a trained medical scribe. The creation of this record is based on the scribe's personal observations and the provider's statements to them. This document has been checked and approved by the attending provider.

## 2015-08-20 NOTE — Addendum Note (Signed)
Encounter addended by: Malena Edman, RN on: 08/20/2015  1:25 PM<BR>     Documentation filed: Inpatient Patient Education

## 2015-08-21 ENCOUNTER — Ambulatory Visit: Payer: Medicare Other

## 2015-08-21 ENCOUNTER — Ambulatory Visit
Admission: RE | Admit: 2015-08-21 | Discharge: 2015-08-21 | Disposition: A | Discharge status: Home / Self Care | Payer: Medicare Other | Source: Ambulatory Visit | Attending: Radiation Oncology | Admitting: Radiation Oncology

## 2015-08-21 ENCOUNTER — Encounter: Payer: Self-pay | Admitting: Radiation Oncology

## 2015-08-21 DIAGNOSIS — Z510 Encounter for antineoplastic radiation therapy: Secondary | ICD-10-CM | POA: Diagnosis not present

## 2015-08-21 DIAGNOSIS — C50511 Malignant neoplasm of lower-outer quadrant of right female breast: Secondary | ICD-10-CM | POA: Diagnosis not present

## 2015-08-21 DIAGNOSIS — Z170 Estrogen receptor positive status [ER+]: Secondary | ICD-10-CM | POA: Diagnosis not present

## 2015-08-22 ENCOUNTER — Ambulatory Visit: Payer: Medicare Other

## 2015-08-23 ENCOUNTER — Ambulatory Visit: Payer: Medicare Other

## 2015-08-26 ENCOUNTER — Ambulatory Visit: Payer: Medicare Other

## 2015-08-27 ENCOUNTER — Ambulatory Visit: Payer: Medicare Other

## 2015-08-28 ENCOUNTER — Ambulatory Visit: Payer: Medicare Other

## 2015-08-28 NOTE — Progress Notes (Signed)
  Radiation Oncology         [phone]) [phone] ________________________________  Name: DAMARA AGUILLON MRN: [id number]  Date: 08/21/2015  DOB: 12-21-1950  End of Treatment Note  Diagnosis:  Breast cancer of lower-outer quadrant of right female breast [facility])   Staging form: Breast, AJCC 7th Edition     Clinical stage from 05/15/2015: Stage IA (T1c, N0, M0) - Unsigned       Staging comments: Staged at breast conference 1.25.17    Indication for treatment:  Curative    Radiation treatment dates:   07/31/2015-08/21/2015  Site/dose:   Right breast/ 42.72 Gy at 2.67 Gy per fraction x 21 fractions.   Beams/energy:  Opposed tangents with reduced fields / 6 MV photons  Narrative: The patient tolerated radiation treatment relatively well.   She had minimal skin changes and minimal fatigue.   Plan: The patient has completed radiation treatment. The patient will return to radiation oncology clinic for routine followup in one month. I advised them to call or return sooner if they have any questions or concerns related to their recovery or treatment.  ------------------------------------------------  Thea Silversmith, MD

## 2015-08-29 ENCOUNTER — Telehealth: Payer: Self-pay | Admitting: *Deleted

## 2015-08-29 NOTE — Telephone Encounter (Signed)
Left vm for pt to return call to congratulate on completion of xrt and to discuss survivorship program. Contact information provided.

## 2015-09-02 ENCOUNTER — Other Ambulatory Visit: Payer: Self-pay | Admitting: Adult Health

## 2015-09-02 DIAGNOSIS — C50511 Malignant neoplasm of lower-outer quadrant of right female breast: Secondary | ICD-10-CM

## 2015-09-06 ENCOUNTER — Ambulatory Visit (HOSPITAL_BASED_OUTPATIENT_CLINIC_OR_DEPARTMENT_OTHER): Payer: Medicare Other | Admitting: Hematology and Oncology

## 2015-09-06 ENCOUNTER — Other Ambulatory Visit: Payer: Self-pay | Admitting: Hematology and Oncology

## 2015-09-06 ENCOUNTER — Encounter: Payer: Self-pay | Admitting: Hematology and Oncology

## 2015-09-06 ENCOUNTER — Telehealth: Payer: Self-pay | Admitting: Hematology and Oncology

## 2015-09-06 VITALS — BP 154/73 | HR 73 | Temp 98.2°F | Resp 18 | Ht 65.0 in | Wt 158.4 lb

## 2015-09-06 DIAGNOSIS — C50511 Malignant neoplasm of lower-outer quadrant of right female breast: Secondary | ICD-10-CM

## 2015-09-06 DIAGNOSIS — M8580 Other specified disorders of bone density and structure, unspecified site: Secondary | ICD-10-CM | POA: Diagnosis not present

## 2015-09-06 MED ORDER — TAMOXIFEN CITRATE 20 MG PO TABS: 20.0000 mg | ORAL_TABLET | Freq: Every day | ORAL | Status: DC

## 2015-09-06 NOTE — Assessment & Plan Note (Signed)
Rt Lumpectomy 06/17/15: IDC with DCIS 1.5 cm , 0/3 LN Neg, T1cN0 (Stage 1 A) grade 1, ER 100%, PR 40%, Ki-67 20%, HER-2 negative ratio 1.26, margins Neg, Oncotype DX score 19, 12% ROR Adjuvant radiation therapy from 07/31/2015 to 08/21/2015  Treatment plan: Adjuvant antiestrogen therapy with anastrozole 1 mg daily 5 years  Anastrozole counseling: We discussed the risks and benefits of anti-estrogen therapy with aromatase inhibitors. These include but not limited to insomnia, hot flashes, mood changes, vaginal dryness, bone density loss, and weight gain. We strongly believe that the benefits far outweigh the risks. Patient understands these risks and consented to starting treatment. Planned treatment duration is 5 years.  Return to clinic in 3 months for toxicity check and follow up

## 2015-09-06 NOTE — Telephone Encounter (Signed)
appt made and avs printed °

## 2015-09-06 NOTE — Progress Notes (Addendum)
Patient Care Team: Donald Prose, MD as PCP - General (Family Medicine) Autumn Messing III, MD as Consulting Physician (General Surgery) Nicholas Lose, MD as Consulting Physician (Hematology and Oncology) Thea Silversmith, MD as Consulting Physician (Radiation Oncology) Sylvan Cheese, NP as Nurse Practitioner (Hematology and Oncology)  DIAGNOSIS: Breast cancer of lower-outer quadrant of right female breast [facility])   Staging form: Breast, AJCC 7th Edition     Clinical stage from 05/15/2015: Stage IA (T1c, N0, M0) - Unsigned       Staging comments: Staged at breast conference 1.25.17  SUMMARY OF ONCOLOGIC HISTORY:   Breast cancer of lower-outer quadrant of right female breast ([place])   05/03/2015 Mammogram Right breast Tomo: Distortion at 8:30 position: 1.3 x 1.5 x 1.6 cm by ultrasound, additional mass superficial 9 mm size, low density benign-appearing, T1 cN0 stage IA clinical stage   05/03/2015 Initial Diagnosis Right breast biopsy 8:30 position: Invasive ductal carcinoma with perineural invasion, grade 1, ER 100%, PR 40%, Ki-67 20%, HER-2 negative ratio 1.26   06/17/2015 Surgery Rt Lumpectomy: IDC with DCIS 1.5 cm , 0/3 LN Neg, T1cN0 (Stage 1 A) grade 2, ER 100%, PR 40%, Ki-67 20%, HER-2 negative ratio 1.26, margins Neg, Oncotype DX score 19, 12% ROR   07/31/2015 - 08/21/2015 Radiation Therapy Adjuvant radiation therapy    CHIEF COMPLIANT: Follow-up after radiation therapy  INTERVAL HISTORY: Karen Roth is a 65 year old with above-mentioned history of right breast invasive ductal carcinoma status post lumpectomy and radiation is here today to discuss the pros and cons of adjuvant antiestrogen therapy. She had done quite well from radiation she is still slightly sore and mildly uncomfortable from the skin reaction to radiation.   REVIEW OF SYSTEMS:   Constitutional: Denies fevers, chills or abnormal weight loss Eyes: Denies blurriness of vision Ears, nose, mouth, throat, and face: Denies  mucositis or sore throat Respiratory: Denies cough, dyspnea or wheezes Cardiovascular: Denies palpitation, chest discomfort Gastrointestinal:  Denies nausea, heartburn or change in bowel habits Skin: Denies abnormal skin rashes Lymphatics: Denies new lymphadenopathy or easy bruising Neurological:Denies numbness, tingling or new weaknesses Behavioral/Psych: Mood is stable, no new changes  Extremities: No lower extremity edema Breast: Recent radiation-induced dermatitis All other systems were reviewed with the patient and are negative.  I have reviewed the past medical history, past surgical history, social history and family history with the patient and they are unchanged from previous note.  ALLERGIES:  is allergic to doxycycline; lisinopril; and shellfish allergy.  MEDICATIONS:  Current Outpatient Prescriptions  Medication Sig Dispense Refill  . acetaminophen (TYLENOL) 500 MG tablet Take 500-1,000 mg by mouth daily as needed for mild pain or headache. Reported on 08/20/2015    . ALPRAZolam (XANAX) 0.5 MG tablet Take 0.5 mg by mouth 3 (three) times daily as needed for anxiety.     Marland Kitchen amLODipine (NORVASC) 5 MG tablet Take 5 mg by mouth daily.     . APPLE CIDER VINEGAR PO Take 15 mLs by mouth daily.    Marland Kitchen aspirin EC 81 MG tablet Take 81 mg by mouth daily.     . Calcium Citrate-Vitamin D (CITRACAL + D PO) Take 1 tablet by mouth daily.     . Cholecalciferol (VITAMIN D3) 2000 units capsule Take 2,000 Units by mouth daily.     . cromolyn (OPTICROM) 4 % ophthalmic solution INT 1 GTT INTO OU TWO TO QID  3  . furosemide (LASIX) 20 MG tablet Take by mouth.    . Multiple  Vitamin (MULTI-VITAMINS) TABS Take by mouth.    . Omega-3 1000 MG CAPS Take by mouth.    Marland Kitchen omeprazole (PRILOSEC) 20 MG capsule Take 20 mg by mouth daily as needed (for heartburn or acid reflux). Reported on 07/18/2015    . protein supplement shake (PREMIER PROTEIN) LIQD Take 2 oz by mouth daily as needed (for bone support).     No  current facility-administered medications for this visit.    PHYSICAL EXAMINATION: ECOG PERFORMANCE STATUS: 1 - Symptomatic but completely ambulatory  Filed Vitals:   09/06/15 0940  BP: 154/73  Pulse: 73  Temp: 98.2 F (36.8 C)  Resp: 18   Filed Weights   09/06/15 0940  Weight: 158 lb 6.4 oz (71.85 kg)    GENERAL:alert, no distress and comfortable SKIN: skin color, texture, turgor are normal, no rashes or significant lesions EYES: normal, Conjunctiva are pink and non-injected, sclera clear OROPHARYNX:no exudate, no erythema and lips, buccal mucosa, and tongue normal  NECK: supple, thyroid normal size, non-tender, without nodularity LYMPH:  no palpable lymphadenopathy in the cervical, axillary or inguinal LUNGS: clear to auscultation and percussion with normal breathing effort HEART: regular rate & rhythm and no murmurs and no lower extremity edema ABDOMEN:abdomen soft, non-tender and normal bowel sounds MUSCULOSKELETAL:no cyanosis of digits and no clubbing  NEURO: alert & oriented x 3 with fluent speech, no focal motor/sensory deficits EXTREMITIES: No lower extremity edema  LABORATORY DATA:  I have reviewed the data as listed   Chemistry      Component Value Date/Time   NA 139 06/10/2015 1041   NA 141 05/15/2015 0910   K 3.6 06/10/2015 1041   K 4.3 05/15/2015 0910   CL 103 06/10/2015 1041   CO2 26 06/10/2015 1041   CO2 28 05/15/2015 0910   BUN 18 06/10/2015 1041   BUN 17.7 05/15/2015 0910   CREATININE 1.06* 06/10/2015 1041   CREATININE 1.3* 05/15/2015 0910      Component Value Date/Time   CALCIUM 10.0 06/10/2015 1041   CALCIUM 10.0 05/15/2015 0910   ALKPHOS 60 05/15/2015 0910   ALKPHOS 41 01/06/2011 0904   AST 19 05/15/2015 0910   AST 17 01/06/2011 0904   ALT 18 05/15/2015 0910   ALT 15 01/06/2011 0904   BILITOT <0.30 05/15/2015 0910   BILITOT 0.3 01/06/2011 0904       Lab Results  Component Value Date   WBC 9.0 06/10/2015   HGB 15.0 06/10/2015    HCT 45.7 06/10/2015   MCV 84.8 06/10/2015   PLT 248 06/10/2015   NEUTROABS 9.5* 05/15/2015     ASSESSMENT & PLAN:  Breast cancer of lower-outer quadrant of right female breast ([place]) Rt Lumpectomy 06/17/15: IDC with DCIS 1.5 cm , 0/3 LN Neg, T1cN0 (Stage 1 A) grade 1, ER 100%, PR 40%, Ki-67 20%, HER-2 negative ratio 1.26, margins Neg, Oncotype DX score 19, 12% ROR Adjuvant radiation therapy from 07/31/2015 to 08/21/2015  Treatment plan: Adjuvant antiestrogen therapy with anastrozole 1 mg daily 5 years Vs Tamoxifen  Anastrozole counseling: We discussed the risks and benefits of anti-estrogen therapy with aromatase inhibitors. These include but not limited to insomnia, hot flashes, mood changes, vaginal dryness, bone density loss, and weight gain. We strongly believe that the benefits far outweigh the risks. Patient understands these risks and consented to starting treatment. Planned treatment duration is 5 years.  If the patient has significant bone loss in the bone density test then we will have to consider prescription with tamoxifen. I  requested a copy of the bone density test from [facility]. We will evaluate the results and will call the patient back with the final decision regarding the pill that she will need to take. Patient tells me that she has been diagnosed with osteopenia could not tolerate oral bisphosphonate therapy in the past.  He Return to clinic in 3 months for toxicity check and follow up   No orders of the defined types were placed in this encounter.   The patient has a good understanding of the overall plan. she agrees with it. she will call with any problems that may develop before the next visit here.   Rulon Eisenmenger, MD 09/06/2015    ADDENDUM: The results of bone density done in 2014 showed that she had -2.0 T score showing osteopenia. Because she could not tolerate bisphosphonate therapy, I suspect that her osteopenia would have gotten much worse  since then. I sent a prescription for tamoxifen and left a voicemail with the patient that I'm calling in prescription for tamoxifen that will be started once a day. I also instructed her to get another bone density test and [facility]. We will see her back in 3 months for toxicity check

## 2015-09-09 DIAGNOSIS — H18413 Arcus senilis, bilateral: Secondary | ICD-10-CM | POA: Diagnosis not present

## 2015-09-09 DIAGNOSIS — H2513 Age-related nuclear cataract, bilateral: Secondary | ICD-10-CM | POA: Diagnosis not present

## 2015-09-09 DIAGNOSIS — H40021 Open angle with borderline findings, high risk, right eye: Secondary | ICD-10-CM | POA: Diagnosis not present

## 2015-09-09 DIAGNOSIS — H11153 Pinguecula, bilateral: Secondary | ICD-10-CM | POA: Diagnosis not present

## 2015-09-09 DIAGNOSIS — H11423 Conjunctival edema, bilateral: Secondary | ICD-10-CM | POA: Diagnosis not present

## 2015-09-09 DIAGNOSIS — H401221 Low-tension glaucoma, left eye, mild stage: Secondary | ICD-10-CM | POA: Diagnosis not present

## 2015-09-09 DIAGNOSIS — H25013 Cortical age-related cataract, bilateral: Secondary | ICD-10-CM | POA: Diagnosis not present

## 2015-09-09 DIAGNOSIS — H3589 Other specified retinal disorders: Secondary | ICD-10-CM | POA: Diagnosis not present

## 2015-09-09 DIAGNOSIS — H1851 Endothelial corneal dystrophy: Secondary | ICD-10-CM | POA: Diagnosis not present

## 2015-09-17 DIAGNOSIS — H401131 Primary open-angle glaucoma, bilateral, mild stage: Secondary | ICD-10-CM | POA: Diagnosis not present

## 2015-09-26 NOTE — Progress Notes (Signed)
Mrs. Silver Willems. Eppel is here for a one month for follow up visit for breast cancer lower-outer ri quadrant of right female breast.  Skin status:Right breast with dark hyperpigmentation,breast is sensitive to touch. Lotion being used: Vitamin E oil. Have you seen your medical oncologist? Date If not ,when is appointment Dr. Lindi Adie 12-04-15 When is your next mammogram? Has it been scheduled ? :  ER+,have started AI or Tamoxifen? If not, why?  Tamoxifen Discuss survivorship appointment:10-03-15     Chestine Spore Offer referral reading material for Survivorship, [employer] and [facility] given  08-20-15 Appetite: Good Pain: No Arm mobility: Able to raise without difficulty. Fatigue:None BP 138/70 mmHg  Pulse 74  Temp(Src) 97.9 F (36.6 C) (Oral)  Resp 18  Ht 5\' 5"  (1.651 m)  Wt 162 lb (73.483 kg)  BMI 26.96 kg/m2  SpO2 100%

## 2015-10-01 DIAGNOSIS — C50511 Malignant neoplasm of lower-outer quadrant of right female breast: Secondary | ICD-10-CM | POA: Diagnosis not present

## 2015-10-02 DIAGNOSIS — H401131 Primary open-angle glaucoma, bilateral, mild stage: Secondary | ICD-10-CM | POA: Diagnosis not present

## 2015-10-03 ENCOUNTER — Encounter: Payer: Self-pay | Admitting: Nurse Practitioner

## 2015-10-03 ENCOUNTER — Encounter: Payer: Self-pay | Admitting: Radiation Oncology

## 2015-10-03 ENCOUNTER — Ambulatory Visit (HOSPITAL_BASED_OUTPATIENT_CLINIC_OR_DEPARTMENT_OTHER): Payer: Medicare Other | Admitting: Nurse Practitioner

## 2015-10-03 ENCOUNTER — Ambulatory Visit
Admission: RE | Admit: 2015-10-03 | Discharge: 2015-10-03 | Disposition: A | Discharge status: Home / Self Care | Payer: Medicare Other | Source: Ambulatory Visit | Attending: Radiation Oncology | Admitting: Radiation Oncology

## 2015-10-03 VITALS — BP 127/65 | HR 72 | Temp 98.2°F | Resp 18 | Ht 65.0 in | Wt 162.6 lb

## 2015-10-03 VITALS — BP 138/70 | HR 74 | Temp 97.9°F | Resp 18 | Ht 65.0 in | Wt 162.0 lb

## 2015-10-03 DIAGNOSIS — Z170 Estrogen receptor positive status [ER+]: Secondary | ICD-10-CM | POA: Diagnosis not present

## 2015-10-03 DIAGNOSIS — C50511 Malignant neoplasm of lower-outer quadrant of right female breast: Secondary | ICD-10-CM | POA: Insufficient documentation

## 2015-10-03 DIAGNOSIS — M8580 Other specified disorders of bone density and structure, unspecified site: Secondary | ICD-10-CM

## 2015-10-03 NOTE — Progress Notes (Signed)
CLINIC:  Cancer Survivorship   REASON FOR VISIT:  Routine follow-up post-treatment for a recent history of breast cancer.  BRIEF ONCOLOGIC HISTORY:    Breast cancer of lower-outer quadrant of right female breast ([place])   05/03/2015 Mammogram Right breast tomo: Distortion at 8:30 position: 1.3 x 1.5 x 1.6 cm by ultrasound, additional mass superficial 9 mm size, low density benign-appearing   05/03/2015 Initial Diagnosis Right breast biopsy 8:30 position: Invasive ductal carcinoma with perineural invasion, grade 1, ER 100%, PR 40%, Ki-67 20%, HER-2 negative ratio 1.26   05/03/2015 Clinical Stage Stage IA: T1c N0   06/17/2015 Surgery Rt Lumpectomy: IDC with DCIS 1.5 cm , 0/3 LN Neg, grade 2, ER 100%, PR 40%, Ki-67 20%, HER-2 negative ratio 1.26, margins neg   06/17/2015 Pathologic Stage Stage IA: T1c N0   06/17/2015 Oncotype testing RS 19 (12% ROR)   07/31/2015 - 08/21/2015 Radiation Therapy Adjuvant radiation therapy: Right breast/ 42.72 Gy at 2.67 Gy per fraction x 21 fractions.    09/06/2015 -  Anti-estrogen oral therapy Tamoxifen 20 mg daily.  Planned duration of therapy 5 years.    INTERVAL HISTORY:  Karen Roth presents to the [facility] today for our initial meeting to review her survivorship care plan detailing her treatment course for breast cancer, as well as monitoring long-term side effects of that treatment, education regarding health maintenance, screening, and overall wellness and health promotion.     Overall, Karen Roth reports feeling quite well since completing her radiation therapy approximately one month ago.  She continues with fatigue but reports the skin changes overlying her right breast have improved.  She denies headache, cough, shortness of breath or bone pain.  She has a good  appetite and denies any weight loss.  She has begun her tamoxifen and thus far, is tolerating it reasonably well with minimal hot flashes.  She denies any chest pain, calf pain or swelling.   She is S/P hysterectomy.  REVIEW OF SYSTEMS:  General: Fatigue and hot flashes as above. Denies fever, chills, unintentional weight loss, or night sweats. HEENT: Denies visual changes, hearing loss, mouth sores or difficulty swallowing. Cardiac: Denies palpitations, chest pain, and lower extremity edema.  Respiratory: Denies wheeze or dyspnea on exertion.  Breast: As above. GI: Denies abdominal pain, constipation, diarrhea, nausea, or vomiting.  GU: Denies dysuria, hematuria, vaginal bleeding, vaginal discharge, or vaginal dryness.  Musculoskeletal: As above. Neuro: Denies recent fall or numbness / tingling in her extremities. Skin: Denies rash, pruritis, or open wounds.  Psych: Denies depression, anxiety, insomnia, or memory loss.   A 14-point review of systems was completed and was negative, except as noted above.   ONCOLOGY TREATMENT TEAM:  1. Surgeon:  Dr. Marlou Starks at [facility]  2. Medical Oncologist: Dr. Lindi Adie 3. Radiation Oncologist: Dr. Pablo Ledger    PAST MEDICAL/SURGICAL HISTORY:  Past Medical History  Diagnosis Date  . Breast cancer ([place])   . Arthritis   . Hypertension     takes Amlodipine daily  . Anxiety     takes Xanax daily as needed  . History of bronchitis > 20 yrs ago  . Headache     more so since diagnosis of breast cancer  . Osteoarthritis   . Neck pain     and right shoulder.Arthritis and Bone Spurs  . GERD (gastroesophageal reflux disease)     takes Omeprazole daily as needed  . History of colon polyps     benign  . Urinary frequency   .  History of renal failure 2013    saw a kidney specialist and was released a yr ago.Thought it was back related   Past Surgical History  Procedure Laterality Date  . Back surgery  1971    [place] Rod Placement  . Appendectomy    . Abdominal hysterectomy    . Tonsillectomy    . Colonoscopy    . Back surgery    . Breast lumpectomy with radioactive seed and sentinel lymph node biopsy Right  06/17/2015    Procedure: BREAST LUMPECTOMY WITH RADIOACTIVE SEED AND SENTINEL LYMPH NODE BIOPSY;  Surgeon: Autumn Messing III, MD;  Location: [facility];  Service: General;  Laterality: Right;     ALLERGIES:  Allergies  Allergen Reactions  . Doxycycline Other (See Comments)    Stomach burning and pain  . Lisinopril Other (See Comments)    Kidney failure  . Shellfish Allergy Hives     CURRENT MEDICATIONS:  Current Outpatient Prescriptions on File Prior to Visit  Medication Sig Dispense Refill  . acetaminophen (TYLENOL) 500 MG tablet Take 500-1,000 mg by mouth daily as needed for mild pain or headache. Reported on 08/20/2015    . ALPRAZolam (XANAX) 0.5 MG tablet Take 0.5 mg by mouth 3 (three) times daily as needed for anxiety.     Marland Kitchen amLODipine (NORVASC) 5 MG tablet Take 5 mg by mouth daily.     . APPLE CIDER VINEGAR PO Take 15 mLs by mouth daily.    Marland Kitchen aspirin EC 81 MG tablet Take 81 mg by mouth daily.     . Calcium Citrate-Vitamin D (CITRACAL + D PO) Take 1 tablet by mouth daily.     . Cholecalciferol (VITAMIN D3) 2000 units capsule Take 2,000 Units by mouth daily.     . cromolyn (OPTICROM) 4 % ophthalmic solution INT 1 GTT INTO OU TWO TO QID  3  . furosemide (LASIX) 20 MG tablet Take by mouth.    . Multiple Vitamin (MULTI-VITAMINS) TABS Take by mouth.    . Omega-3 1000 MG CAPS Take by mouth.    Marland Kitchen omeprazole (PRILOSEC) 20 MG capsule Take 20 mg by mouth daily as needed (for heartburn or acid reflux). Reported on 07/18/2015    . protein supplement shake (PREMIER PROTEIN) LIQD Take 2 oz by mouth daily as needed (for bone support).    . tamoxifen (NOLVADEX) 20 MG tablet Take 1 tablet (20 mg total) by mouth daily. 30 tablet 3   No current facility-administered medications on file prior to visit.     ONCOLOGIC FAMILY HISTORY:  Family History  Problem Relation Age of Onset  . Cancer Maternal Aunt     GYN  . Breast cancer Cousin     GENETIC COUNSELING/TESTING: No   SOCIAL HISTORY:  ELLIANA Roth is divorced and lives alone in [place], [place].  She has 1 child. Ms. Snodgrass is currently retired.  She is a current smoker and denies any current or history of illicit drug use.  She drinks a glass of wine nightly.   PHYSICAL EXAMINATION:  Vital Signs: Filed Vitals:   10/03/15 0908  BP: 127/65  Pulse: 72  Temp: 98.2 F (36.8 C)  Resp: 18   ECOG Performance Status: 0  General: Well-nourished, well-appearing female in no acute distress.  She is unaccompanied in clinic today.   HEENT: Head is atraumatic and normocephalic.  Pupils equal and reactive to light and accomodation. Conjunctivae clear without exudate.  Sclerae anicteric. Oral mucosa is pink, moist,  and intact without lesions.  Oropharynx is pink without lesions or erythema.  Lymph: No cervical, supraclavicular, infraclavicular, or axillary lymphadenopathy noted on palpation.  Cardiovascular: Regular rate and rhythm without murmurs, rubs, or gallops. Respiratory: Clear to auscultation bilaterally. Chest expansion symmetric without accessory muscle use on inspiration or expiration.  GI: Abdomen soft and round. No tenderness to palpation. Bowel sounds normoactive in 4 quadrants. GU: Deferred.  Neuro: No focal deficits. Steady gait.  Psych: Mood and affect normal and appropriate for situation.  Extremities: No edema, cyanosis, or clubbing.  Skin: Warm and dry. No open lesions noted.   LABORATORY DATA:  None for this visit.  DIAGNOSTIC IMAGING:  None for this visit.     ASSESSMENT AND PLAN:   1. Breast cancer: Stage IA invasive ductal carcinoma with ductal carcinoma in situ of the right breast (04/2015), grade 1, ER positive, PR positive, HER2/neu negative, S/P lumpectomy/SLNB (05/2015), oncotype RS 19, followed by adjuvant radiation therapy to the right breast completed 08/2015 with tamoxifen begun 08/2015 (due to osteopenia) with plans for 5 years of therapy.  Ms. Mccombs is doing well without clinical  symptoms worrisome for disease recurrence. She will follow-up with her medical oncologist,  Dr. Lindi Adie, in August 2017 with history and physical examination per surveillance protocol and will also see Dr. Marlou Starks as scheduled.  She will continue her anti-estrogen therapy with tamoxifen at this time and report any new or increased side effects of the medication.  She is S/P hysterectomy. A comprehensive survivorship care plan and treatment summary was reviewed with the patient today detailing her breast cancer diagnosis, treatment course, potential late/long-term effects of treatment, appropriate follow-up care with recommendations for the future, and patient education resources.  A copy of this summary, along with a letter will be sent to the patient's primary care provider via in basket message after today's visit.  Ms. Depolo is welcome to return to the Survivorship Clinic in the future, as needed; no follow-up will be scheduled at this time.    2. Bone health:   Her last bone density testing was performed at her gynecologist's office (osteopenia) and she believes that she is due for an updated scan this July 2017.  We will continue to monitor this closely while she is on endocrine therapy.  In the meantime, she was encouraged to increase her consumption of foods rich in calcium and vitamin D as well as to increase her weight-bearing activities.  She was given education on specific activities to promote bone health.  3. Cancer screening:  Due to Ms. Mandala history and her age, she should receive screening for skin cancers and colon cancer.  She is S/P hysterectomy.  The information and recommendations are listed on the patient's comprehensive care plan/treatment summary and were reviewed in detail with the patient.    4. Health maintenance and wellness promotion: Ms. Skow was encouraged to consume 5-7 servings of fruits and vegetables per day. We reviewed the "Nutrition Rainbow" handout, as well as  discussed recommendations to maximize nutrition and minimize recurrence, such as increased intake of fruits, vegetables, lean proteins, and minimizing the intake of red meats and processed foods.  She was also encouraged to engage in moderate to vigorous exercise for 30 minutes per day most days of the week. We discussed the [employer] fitness program, which is designed for cancer survivors to help them become more physically fit after cancer treatments.  She was instructed to limit her alcohol consumption and was strongly encouraged to  stop smoking.  She is down to 2 cigarettes / day and some days, she states she does not smoke at all.  A copy of the "Take Control of Your Health" brochure was given to her reinforcing these recommendations.   5. Support services/counseling: It is not uncommon for this period of the patient's cancer care trajectory to be one of many emotions and stressors.  We discussed an opportunity for her to participate in the next session of [facility] ("Finding Your New Normal") support group series designed for patients after they have completed treatment.  Ms. Flores was encouraged to take advantage of our many other support services programs, support groups, and/or counseling in coping with her new life as a cancer survivor after completing anti-cancer treatment. She was offered support today through active listening and expressive supportive counseling.  She was given information regarding our available services and encouraged to contact me with any questions or for help enrolling in any of our support group/programs.    A total of 50 minutes of face-to-face time was spent with this patient with greater than 50% of that time in counseling and care-coordination.   Sylvan Cheese, NP  Survivorship Program [facility] [phone]   Note: PRIMARY CARE PROVIDER Lynne Logan, MD [phone]

## 2015-10-03 NOTE — Progress Notes (Signed)
   Department of Radiation Oncology  Phone:  [phone] Fax:        [phone]   Name: Karen Roth MRN: [id number]  DOB: 10-Oct-1950  Date: 10/03/2015  Follow Up Visit Note  Diagnosis: Breast cancer of lower-outer quadrant of right female breast [facility])   Staging form: Breast, AJCC 7th Edition     Clinical stage from 05/15/2015: Stage IA (T1c, N0, M0) - Unsigned       Staging comments: Staged at breast conference 1.25.17      Pathologic stage from 06/17/2015: Stage IA (T1c, N0, cM0) - Unsigned   Summary and Interval since last radiation: 1 month (07/31/2015 to 08/21/2015)  Site/dose:  Right breast/ 42.72 Gy at 2.67 Gy per fraction x 21 fractions.  Interval History: Karen Roth presents today for routine followup.  She last saw Dr. Marlou Starks on Tuesday, 6/13. She spoke with him about residual breast tenderness and was told this would decrease with time. She reports "zaps" of pain in the right axillary region at the incision site.   Physical Exam:  Filed Vitals:   10/03/15 0842  BP: 138/70  Pulse: 74  Temp: 97.9 F (36.6 C)  TempSrc: Oral  Resp: 18  Height: 5\' 5"  (1.651 m)  Weight: 162 lb (73.483 kg)  SpO2: 100%    No lymphedema present in bilateral upper extremities. Slight hyperpigmentation of skin in the right breast.    IMPRESSION: Karen Roth is a 65 y.o. female with breast cancer of the lower-outer right quadrant of the right female breast and recovering from acute effects of radiation.  PLAN:  She last saw Dr. Marlou Starks on Tuesday, 6/13. She is scheduled for a survivorship care plan visit later today. Her next scheduled appointment with Dr. Lindi Adie is 12/04/2015. She is doing well. We discussed the need for follow up every 4-6 months which she has scheduled.  We discussed the need for yearly mammograms which she can schedule with her OBGYN or with medical oncology. We discussed the need for sun protection in the treated area.  She can always call me with questions.  I will follow up  with her on an as needed basis.      Thea Silversmith, MD  This document serves as a record of services personally performed by Thea Silversmith, MD. It was created on her behalf by Arlyce Harman, a trained medical scribe. The creation of this record is based on the scribe's personal observations and the provider's statements to them. This document has been checked and approved by the attending provider.

## 2015-10-03 NOTE — Addendum Note (Signed)
Encounter addended by: Malena Edman, RN on: 10/03/2015  9:55 AM<BR>     Documentation filed: Charges VN

## 2015-11-15 DIAGNOSIS — H401131 Primary open-angle glaucoma, bilateral, mild stage: Secondary | ICD-10-CM | POA: Diagnosis not present

## 2015-12-04 ENCOUNTER — Ambulatory Visit (HOSPITAL_BASED_OUTPATIENT_CLINIC_OR_DEPARTMENT_OTHER): Payer: Medicare Other | Admitting: Hematology and Oncology

## 2015-12-04 ENCOUNTER — Encounter: Payer: Self-pay | Admitting: Hematology and Oncology

## 2015-12-04 ENCOUNTER — Telehealth: Payer: Self-pay | Admitting: Hematology and Oncology

## 2015-12-04 DIAGNOSIS — N951 Menopausal and female climacteric states: Secondary | ICD-10-CM

## 2015-12-04 DIAGNOSIS — M791 Myalgia: Secondary | ICD-10-CM | POA: Diagnosis not present

## 2015-12-04 DIAGNOSIS — C50511 Malignant neoplasm of lower-outer quadrant of right female breast: Secondary | ICD-10-CM

## 2015-12-04 DIAGNOSIS — M8580 Other specified disorders of bone density and structure, unspecified site: Secondary | ICD-10-CM | POA: Diagnosis not present

## 2015-12-04 NOTE — Telephone Encounter (Signed)
appt made and avs printed °

## 2015-12-04 NOTE — Progress Notes (Signed)
Patient Care Team: Donald Prose, MD as PCP - General (Family Medicine) Autumn Messing III, MD as Consulting Physician (General Surgery) Nicholas Lose, MD as Consulting Physician (Hematology and Oncology) Thea Silversmith, MD as Consulting Physician (Radiation Oncology) Sylvan Cheese, NP as Nurse Practitioner (Hematology and Oncology)  DIAGNOSIS: Breast cancer of lower-outer quadrant of right female breast [facility])   Staging form: Breast, AJCC 7th Edition   - Clinical stage from 05/15/2015: Stage IA (T1c, N0, M0) - Unsigned         Staging comments: Staged at breast conference 1.25.17   - Pathologic stage from 06/17/2015: Stage IA (T1c, N0, cM0) - Unsigned  SUMMARY OF ONCOLOGIC HISTORY:   Breast cancer of lower-outer quadrant of right female breast ([place])   05/03/2015 Mammogram    Right breast tomo: Distortion at 8:30 position: 1.3 x 1.5 x 1.6 cm by ultrasound, additional mass superficial 9 mm size, low density benign-appearing     05/03/2015 Initial Diagnosis    Right breast biopsy 8:30 position: Invasive ductal carcinoma with perineural invasion, grade 1, ER 100%, PR 40%, Ki-67 20%, HER-2 negative ratio 1.26     05/03/2015 Clinical Stage    Stage IA: T1c N0     06/17/2015 Surgery    Rt Lumpectomy: IDC with DCIS 1.5 cm , 0/3 LN Neg, grade 2, ER 100%, PR 40%, Ki-67 20%, HER-2 negative ratio 1.26, margins neg     06/17/2015 Pathologic Stage    Stage IA: T1c N0     06/17/2015 Oncotype testing    RS 19 (12% ROR)     07/31/2015 - 08/21/2015 Radiation Therapy    Adjuvant radiation therapy: Right breast/ 42.72 Gy at 2.67 Gy per fraction x 21 fractions.      09/06/2015 -  Anti-estrogen oral therapy    Tamoxifen 20 mg daily.  Planned duration of therapy 5 years.     10/03/2015 Survivorship    SCP visit completed      CHIEF COMPLIANT: Hot flashes related to tamoxifen  INTERVAL HISTORY: Karen Roth is a 65 year old above-mentioned history of right breast cancer currently on adjuvant  tamoxifen therapy started 09/06/2015. She reports that the hot flashes were quite severe in the beginning and they got slightly better over time. Her major complaint is that at nighttime she gets his hot flashes and wakes her up. Because of this her sleep is interrupted. She does have occasional aches and pains. She originally had lots of back issues and those aches and pains have persisted and new musculoskeletal aches and pains have started because of tamoxifen.  REVIEW OF SYSTEMS:   Constitutional: Denies fevers, chills or abnormal weight loss Eyes: Denies blurriness of vision Ears, nose, mouth, throat, and face: Denies mucositis or sore throat Respiratory: Denies cough, dyspnea or wheezes Cardiovascular: Denies palpitation, chest discomfort Gastrointestinal:  Denies nausea, heartburn or change in bowel habits Skin: Denies abnormal skin rashes Lymphatics: Denies new lymphadenopathy or easy bruising Neurological:Denies numbness, tingling or new weaknesses Behavioral/Psych: Mood is stable, no new changes  Extremities: Musculoskeletal pains  All other systems were reviewed with the patient and are negative.  I have reviewed the past medical history, past surgical history, social history and family history with the patient and they are unchanged from previous note.  ALLERGIES:  is allergic to doxycycline; lisinopril; and shellfish allergy.  MEDICATIONS:  Current Outpatient Prescriptions  Medication Sig Dispense Refill  . acetaminophen (TYLENOL) 500 MG tablet Take 500-1,000 mg by mouth daily as needed for mild pain  or headache. Reported on 08/20/2015    . ALPRAZolam (XANAX) 0.5 MG tablet Take 0.5 mg by mouth 3 (three) times daily as needed for anxiety.     Marland Kitchen amLODipine (NORVASC) 5 MG tablet Take 5 mg by mouth daily.     . APPLE CIDER VINEGAR PO Take 15 mLs by mouth daily.    Marland Kitchen aspirin EC 81 MG tablet Take 81 mg by mouth daily.     . Calcium Citrate-Vitamin D (CITRACAL + D PO) Take 1 tablet  by mouth daily.     . Cholecalciferol (VITAMIN D3) 2000 units capsule Take 2,000 Units by mouth daily.     . cromolyn (OPTICROM) 4 % ophthalmic solution INT 1 GTT INTO OU TWO TO QID  3  . furosemide (LASIX) 20 MG tablet Take by mouth.    . Multiple Vitamin (MULTI-VITAMINS) TABS Take by mouth.    . Omega-3 1000 MG CAPS Take by mouth.    Marland Kitchen omeprazole (PRILOSEC) 20 MG capsule Take 20 mg by mouth daily as needed (for heartburn or acid reflux). Reported on 07/18/2015    . protein supplement shake (PREMIER PROTEIN) LIQD Take 2 oz by mouth daily as needed (for bone support).    . tamoxifen (NOLVADEX) 20 MG tablet Take 1 tablet (20 mg total) by mouth daily. 30 tablet 3   No current facility-administered medications for this visit.     PHYSICAL EXAMINATION: ECOG PERFORMANCE STATUS: 1 - Symptomatic but completely ambulatory  There were no vitals filed for this visit. There were no vitals filed for this visit.  GENERAL:alert, no distress and comfortable SKIN: skin color, texture, turgor are normal, no rashes or significant lesions EYES: normal, Conjunctiva are pink and non-injected, sclera clear OROPHARYNX:no exudate, no erythema and lips, buccal mucosa, and tongue normal  NECK: supple, thyroid normal size, non-tender, without nodularity LYMPH:  no palpable lymphadenopathy in the cervical, axillary or inguinal LUNGS: clear to auscultation and percussion with normal breathing effort HEART: regular rate & rhythm and no murmurs and no lower extremity edema ABDOMEN:abdomen soft, non-tender and normal bowel sounds MUSCULOSKELETAL:no cyanosis of digits and no clubbing  NEURO: alert & oriented x 3 with fluent speech, no focal motor/sensory deficits EXTREMITIES: No lower extremity edema  LABORATORY DATA:  I have reviewed the data as listed   Chemistry      Component Value Date/Time   NA 139 06/10/2015 1041   NA 141 05/15/2015 0910   K 3.6 06/10/2015 1041   K 4.3 05/15/2015 0910   CL 103  06/10/2015 1041   CO2 26 06/10/2015 1041   CO2 28 05/15/2015 0910   BUN 18 06/10/2015 1041   BUN 17.7 05/15/2015 0910   CREATININE 1.06 (H) 06/10/2015 1041   CREATININE 1.3 (H) 05/15/2015 0910      Component Value Date/Time   CALCIUM 10.0 06/10/2015 1041   CALCIUM 10.0 05/15/2015 0910   ALKPHOS 60 05/15/2015 0910   AST 19 05/15/2015 0910   ALT 18 05/15/2015 0910   BILITOT <0.30 05/15/2015 0910       Lab Results  Component Value Date   WBC 9.0 06/10/2015   HGB 15.0 06/10/2015   HCT 45.7 06/10/2015   MCV 84.8 06/10/2015   PLT 248 06/10/2015   NEUTROABS 9.5 (H) 05/15/2015     ASSESSMENT & PLAN:  Breast cancer of lower-outer quadrant of right female breast ([place]) Rt Lumpectomy 06/17/15: IDC with DCIS 1.5 cm , 0/3 LN Neg, T1cN0 (Stage 1 A) grade 1, ER 100%,  PR 40%, Ki-67 20%, HER-2 negative ratio 1.26, margins Neg, Oncotype DX score 19, 12% ROR Adjuvant radiation therapy from 07/31/2015 to 08/21/2015  Current Treatment: Adjuvant antiestrogen therapy with Tamoxifen 20 mg daily started 09/06/2015 (due to osteopenia and inability to tolerate bisphosphonates in the past )  Tamoxifen toxicities: 1. Severe hot flashes especially at bedtime and waking her up 2.  musculoskeletal aches and pains   Return to clinic in 6 months for follow-up and surveillance exams.    No orders of the defined types were placed in this encounter.  The patient has a good understanding of the overall plan. she agrees with it. she will call with any problems that may develop before the next visit here.   Rulon Eisenmenger, MD 12/04/15

## 2015-12-04 NOTE — Assessment & Plan Note (Signed)
Rt Lumpectomy 06/17/15: IDC with DCIS 1.5 cm , 0/3 LN Neg, T1cN0 (Stage 1 A) grade 1, ER 100%, PR 40%, Ki-67 20%, HER-2 negative ratio 1.26, margins Neg, Oncotype DX score 19, 12% ROR Adjuvant radiation therapy from 07/31/2015 to 08/21/2015  Current Treatment: Adjuvant antiestrogen therapy with Tamoxifen 20 mg daily started 09/06/2015 (due to osteopenia and inability to tolerate bisphosphonates in the past )  Tamoxifen toxicities:  Return to clinic in 6 months for follow-up and surveillance exams.

## 2015-12-29 ENCOUNTER — Other Ambulatory Visit: Payer: Self-pay | Admitting: Hematology and Oncology

## 2015-12-29 DIAGNOSIS — C50511 Malignant neoplasm of lower-outer quadrant of right female breast: Secondary | ICD-10-CM

## 2016-01-17 DIAGNOSIS — N189 Chronic kidney disease, unspecified: Secondary | ICD-10-CM | POA: Diagnosis not present

## 2016-01-17 DIAGNOSIS — D631 Anemia in chronic kidney disease: Secondary | ICD-10-CM | POA: Diagnosis not present

## 2016-01-17 DIAGNOSIS — N179 Acute kidney failure, unspecified: Secondary | ICD-10-CM | POA: Diagnosis not present

## 2016-01-31 ENCOUNTER — Other Ambulatory Visit: Payer: Self-pay | Admitting: Hematology and Oncology

## 2016-01-31 DIAGNOSIS — C50511 Malignant neoplasm of lower-outer quadrant of right female breast: Secondary | ICD-10-CM

## 2016-02-05 ENCOUNTER — Other Ambulatory Visit: Payer: Self-pay | Admitting: Hematology and Oncology

## 2016-02-05 DIAGNOSIS — Z853 Personal history of malignant neoplasm of breast: Secondary | ICD-10-CM

## 2016-02-24 DIAGNOSIS — I129 Hypertensive chronic kidney disease with stage 1 through stage 4 chronic kidney disease, or unspecified chronic kidney disease: Secondary | ICD-10-CM | POA: Diagnosis not present

## 2016-02-24 DIAGNOSIS — N183 Chronic kidney disease, stage 3 (moderate): Secondary | ICD-10-CM | POA: Diagnosis not present

## 2016-02-24 DIAGNOSIS — Z1211 Encounter for screening for malignant neoplasm of colon: Secondary | ICD-10-CM | POA: Diagnosis not present

## 2016-02-24 DIAGNOSIS — Z0000 Encounter for general adult medical examination without abnormal findings: Secondary | ICD-10-CM | POA: Diagnosis not present

## 2016-02-24 DIAGNOSIS — D0511 Intraductal carcinoma in situ of right breast: Secondary | ICD-10-CM | POA: Diagnosis not present

## 2016-02-24 DIAGNOSIS — M8580 Other specified disorders of bone density and structure, unspecified site: Secondary | ICD-10-CM | POA: Diagnosis not present

## 2016-02-24 DIAGNOSIS — Z1389 Encounter for screening for other disorder: Secondary | ICD-10-CM | POA: Diagnosis not present

## 2016-03-04 DIAGNOSIS — Z1211 Encounter for screening for malignant neoplasm of colon: Secondary | ICD-10-CM | POA: Diagnosis not present

## 2016-03-10 DIAGNOSIS — C50511 Malignant neoplasm of lower-outer quadrant of right female breast: Secondary | ICD-10-CM | POA: Diagnosis not present

## 2016-04-29 ENCOUNTER — Ambulatory Visit
Admission: RE | Admit: 2016-04-29 | Discharge: 2016-04-29 | Disposition: A | Discharge status: Home / Self Care | Payer: Medicare Other | Source: Ambulatory Visit | Attending: Hematology and Oncology | Admitting: Hematology and Oncology

## 2016-04-29 DIAGNOSIS — Z853 Personal history of malignant neoplasm of breast: Secondary | ICD-10-CM

## 2016-05-12 NOTE — Progress Notes (Signed)
Received bone density scan from [facility] [place].

## 2016-06-02 ENCOUNTER — Ambulatory Visit (HOSPITAL_BASED_OUTPATIENT_CLINIC_OR_DEPARTMENT_OTHER): Payer: Medicare Other | Admitting: Hematology and Oncology

## 2016-06-02 ENCOUNTER — Telehealth: Payer: Self-pay

## 2016-06-02 ENCOUNTER — Encounter: Payer: Self-pay | Admitting: Hematology and Oncology

## 2016-06-02 DIAGNOSIS — R5383 Other fatigue: Secondary | ICD-10-CM

## 2016-06-02 DIAGNOSIS — M8580 Other specified disorders of bone density and structure, unspecified site: Secondary | ICD-10-CM

## 2016-06-02 DIAGNOSIS — C50511 Malignant neoplasm of lower-outer quadrant of right female breast: Secondary | ICD-10-CM

## 2016-06-02 DIAGNOSIS — Z79810 Long term (current) use of selective estrogen receptor modulators (SERMs): Secondary | ICD-10-CM

## 2016-06-02 DIAGNOSIS — Z170 Estrogen receptor positive status [ER+]: Secondary | ICD-10-CM

## 2016-06-02 DIAGNOSIS — M791 Myalgia: Secondary | ICD-10-CM | POA: Diagnosis not present

## 2016-06-02 NOTE — Telephone Encounter (Signed)
Appointments made and avs printed for patient °

## 2016-06-02 NOTE — Progress Notes (Signed)
Patient Care Team: Donald Prose, MD as PCP - General (Family Medicine) Autumn Messing III, MD as Consulting Physician (General Surgery) Nicholas Lose, MD as Consulting Physician (Hematology and Oncology) Thea Silversmith, MD (Inactive) as Consulting Physician (Radiation Oncology) Sylvan Cheese, NP as Nurse Practitioner (Hematology and Oncology)  DIAGNOSIS:  Encounter Diagnosis  Name Primary?  . Malignant neoplasm of lower-outer quadrant of right breast of female, estrogen receptor positive ([place])     SUMMARY OF ONCOLOGIC HISTORY:   Breast cancer of lower-outer quadrant of right female breast ([place])   05/03/2015 Mammogram    Right breast tomo: Distortion at 8:30 position: 1.3 x 1.5 x 1.6 cm by ultrasound, additional mass superficial 9 mm size, low density benign-appearing      05/03/2015 Initial Diagnosis    Right breast biopsy 8:30 position: Invasive ductal carcinoma with perineural invasion, grade 1, ER 100%, PR 40%, Ki-67 20%, HER-2 negative ratio 1.26      05/03/2015 Clinical Stage    Stage IA: T1c N0      06/17/2015 Surgery    Rt Lumpectomy: IDC with DCIS 1.5 cm , 0/3 LN Neg, grade 2, ER 100%, PR 40%, Ki-67 20%, HER-2 negative ratio 1.26, margins neg      06/17/2015 Pathologic Stage    Stage IA: T1c N0      06/17/2015 Oncotype testing    RS 19 (12% ROR)      07/31/2015 - 08/21/2015 Radiation Therapy    Adjuvant radiation therapy: Right breast/ 42.72 Gy at 2.67 Gy per fraction x 21 fractions.       09/06/2015 -  Anti-estrogen oral therapy    Tamoxifen 20 mg daily.  Planned duration of therapy 5 years.      10/03/2015 Survivorship    SCP visit completed       CHIEF COMPLIANT: Follow-up on tamoxifen complaining of severe fatigue and musculoskeletal aches and pains  INTERVAL HISTORY: Karen Roth is a 66 year old with above-mentioned history of right breast cancer treated with lumpectomy and radiation is currently on tamoxifen. She complains of severe fatigue and  body aches and pains. Her hot flashes have improved. She is followed very difficult to get on with her activities.  REVIEW OF SYSTEMS:   Constitutional: Denies fevers, chills or abnormal weight loss Eyes: Denies blurriness of vision Ears, nose, mouth, throat, and face: Denies mucositis or sore throat Respiratory: Denies cough, dyspnea or wheezes Cardiovascular: Denies palpitation, chest discomfort Gastrointestinal:  Denies nausea, heartburn or change in bowel habits Skin: Denies abnormal skin rashes Lymphatics: Denies new lymphadenopathy or easy bruising Neurological:Denies numbness, tingling or new weaknesses Behavioral/Psych: Mood is stable, no new changes  Extremities: No lower extremity edema Breast:  denies any pain or lumps or nodules in either breasts All other systems were reviewed with the patient and are negative.  I have reviewed the past medical history, past surgical history, social history and family history with the patient and they are unchanged from previous note.  ALLERGIES:  is allergic to doxycycline; lisinopril; and shellfish allergy.  MEDICATIONS:  Current Outpatient Prescriptions  Medication Sig Dispense Refill  . acetaminophen (TYLENOL) 500 MG tablet Take 500-1,000 mg by mouth daily as needed for mild pain or headache. Reported on 08/20/2015    . ALPRAZolam (XANAX) 0.5 MG tablet Take 0.5 mg by mouth 3 (three) times daily as needed for anxiety.     Marland Kitchen amLODipine (NORVASC) 5 MG tablet Take 5 mg by mouth daily.     . APPLE CIDER VINEGAR  PO Take 15 mLs by mouth daily.    Marland Kitchen aspirin EC 81 MG tablet Take 81 mg by mouth daily.     . Calcium Citrate-Vitamin D (CITRACAL + D PO) Take 1 tablet by mouth daily.     . Cholecalciferol (VITAMIN D3) 2000 units capsule Take 2,000 Units by mouth daily.     . cromolyn (OPTICROM) 4 % ophthalmic solution INT 1 GTT INTO OU TWO TO QID  3  . furosemide (LASIX) 20 MG tablet Take by mouth.    . Multiple Vitamin (MULTI-VITAMINS) TABS Take by  mouth.    . Omega-3 1000 MG CAPS Take by mouth.    Marland Kitchen omeprazole (PRILOSEC) 20 MG capsule Take 20 mg by mouth daily as needed (for heartburn or acid reflux). Reported on 07/18/2015    . protein supplement shake (PREMIER PROTEIN) LIQD Take 2 oz by mouth daily as needed (for bone support).    . tamoxifen (NOLVADEX) 20 MG tablet TAKE 1 TABLET BY MOUTH DAILY 90 tablet 3   No current facility-administered medications for this visit.     PHYSICAL EXAMINATION: ECOG PERFORMANCE STATUS: 1 - Symptomatic but completely ambulatory  Vitals:   06/02/16 1004  BP: (!) 157/71  Pulse: 91  Resp: 18  Temp: 98.4 F (36.9 C)   Filed Weights   06/02/16 1004  Weight: 172 lb 1.6 oz (78.1 kg)    GENERAL:alert, no distress and comfortable SKIN: skin color, texture, turgor are normal, no rashes or significant lesions EYES: normal, Conjunctiva are pink and non-injected, sclera clear OROPHARYNX:no exudate, no erythema and lips, buccal mucosa, and tongue normal  NECK: supple, thyroid normal size, non-tender, without nodularity LYMPH:  no palpable lymphadenopathy in the cervical, axillary or inguinal LUNGS: clear to auscultation and percussion with normal breathing effort HEART: regular rate & rhythm and no murmurs and no lower extremity edema ABDOMEN:abdomen soft, non-tender and normal bowel sounds MUSCULOSKELETAL:no cyanosis of digits and no clubbing  NEURO: alert & oriented x 3 with fluent speech, no focal motor/sensory deficits EXTREMITIES: No lower extremity edema BREAST: No palpable masses or nodules in either right or left breasts. No palpable axillary supraclavicular or infraclavicular adenopathy no breast tenderness or nipple discharge. (exam performed in the presence of a chaperone)  LABORATORY DATA:  I have reviewed the data as listed   Chemistry      Component Value Date/Time   NA 139 06/10/2015 1041   NA 141 05/15/2015 0910   K 3.6 06/10/2015 1041   K 4.3 05/15/2015 0910   CL 103  06/10/2015 1041   CO2 26 06/10/2015 1041   CO2 28 05/15/2015 0910   BUN 18 06/10/2015 1041   BUN 17.7 05/15/2015 0910   CREATININE 1.06 (H) 06/10/2015 1041   CREATININE 1.3 (H) 05/15/2015 0910      Component Value Date/Time   CALCIUM 10.0 06/10/2015 1041   CALCIUM 10.0 05/15/2015 0910   ALKPHOS 60 05/15/2015 0910   AST 19 05/15/2015 0910   ALT 18 05/15/2015 0910   BILITOT <0.30 05/15/2015 0910       Lab Results  Component Value Date   WBC 9.0 06/10/2015   HGB 15.0 06/10/2015   HCT 45.7 06/10/2015   MCV 84.8 06/10/2015   PLT 248 06/10/2015   NEUTROABS 9.5 (H) 05/15/2015    ASSESSMENT & PLAN:  Breast cancer of lower-outer quadrant of right female breast ([place]) Rt Lumpectomy 06/17/15: IDC with DCIS 1.5 cm , 0/3 LN Neg, T1cN0 (Stage 1 A) grade 1, ER  100%, PR 40%, Ki-67 20%, HER-2 negative ratio 1.26, margins Neg, Oncotype DX score 19, 12% ROR Adjuvant radiation therapy from 07/31/2015 to 08/21/2015  Current Treatment: Adjuvant antiestrogen therapy with Tamoxifen 20 mg daily started 09/06/2015 (due to osteopenia and inability to tolerate bisphosphonates in the past )  Tamoxifen toxicities: 1. Severe hot flashes: Improved 2.  musculoskeletal aches and pains : Got worse 3. Severe fatigue Because of these symptoms are reduced the dosage of tamoxifen to 10 mg daily I reviewed the results of bone density test which showed a T score of -2 osteopenia which has not changed for the past 6 years   Return to clinic in 6 months for follow-up and surveillance exams   I spent 25 minutes talking to the patient of which more than half was spent in counseling and coordination of care.  No orders of the defined types were placed in this encounter.  The patient has a good understanding of the overall plan. she agrees with it. she will call with any problems that may develop before the next visit here.   Rulon Eisenmenger, MD 06/02/16

## 2016-06-02 NOTE — Assessment & Plan Note (Signed)
Rt Lumpectomy 06/17/15: IDC with DCIS 1.5 cm , 0/3 LN Neg, T1cN0 (Stage 1 A) grade 1, ER 100%, PR 40%, Ki-67 20%, HER-2 negative ratio 1.26, margins Neg, Oncotype DX score 19, 12% ROR Adjuvant radiation therapy from 07/31/2015 to 08/21/2015  Current Treatment: Adjuvant antiestrogen therapy with Tamoxifen 20 mg daily started 09/06/2015 (due to osteopenia and inability to tolerate bisphosphonates in the past ) Bone density test obtained, I did not have the results  Tamoxifen toxicities: 1. Severe hot flashes especially at bedtime and waking her up 2.  musculoskeletal aches and pains   Return to clinic in 6 months for follow-up and surveillance exams

## 2016-09-24 DIAGNOSIS — C50511 Malignant neoplasm of lower-outer quadrant of right female breast: Secondary | ICD-10-CM | POA: Diagnosis not present

## 2016-11-01 ENCOUNTER — Telehealth: Payer: Self-pay

## 2016-11-01 NOTE — Telephone Encounter (Signed)
Spoke with patient and she is aware of her new appt as dr vg out of office  Karen Roth

## 2016-11-22 ENCOUNTER — Telehealth: Payer: Self-pay

## 2016-11-22 NOTE — Telephone Encounter (Signed)
Called and left a message with a new appt due to pal day   Karen Roth 

## 2016-11-30 ENCOUNTER — Ambulatory Visit: Payer: Medicare Other | Admitting: Hematology and Oncology

## 2016-12-02 ENCOUNTER — Ambulatory Visit: Payer: Medicare Other | Admitting: Hematology and Oncology

## 2016-12-15 ENCOUNTER — Encounter: Payer: Self-pay | Admitting: Hematology and Oncology

## 2016-12-15 ENCOUNTER — Ambulatory Visit (HOSPITAL_BASED_OUTPATIENT_CLINIC_OR_DEPARTMENT_OTHER): Payer: Medicare Other | Admitting: Hematology and Oncology

## 2016-12-15 DIAGNOSIS — Z79810 Long term (current) use of selective estrogen receptor modulators (SERMs): Secondary | ICD-10-CM

## 2016-12-15 DIAGNOSIS — Z923 Personal history of irradiation: Secondary | ICD-10-CM

## 2016-12-15 DIAGNOSIS — M8580 Other specified disorders of bone density and structure, unspecified site: Secondary | ICD-10-CM

## 2016-12-15 DIAGNOSIS — C50511 Malignant neoplasm of lower-outer quadrant of right female breast: Secondary | ICD-10-CM | POA: Diagnosis not present

## 2016-12-15 DIAGNOSIS — Z170 Estrogen receptor positive status [ER+]: Secondary | ICD-10-CM

## 2016-12-15 MED ORDER — TIMOLOL HEMIHYDRATE 0.25 % OP SOLN: 1.0000 [drp] | Freq: Two times a day (BID) | OPHTHALMIC | 12 refills | Status: DC

## 2016-12-15 NOTE — Progress Notes (Signed)
Patient Care Team: Donald Prose, MD as PCP - General (Family Medicine) Jovita Kussmaul, MD as Consulting Physician (General Surgery) Nicholas Lose, MD as Consulting Physician (Hematology and Oncology) Thea Silversmith, MD (Inactive) as Consulting Physician (Radiation Oncology) Sylvan Cheese, NP as Nurse Practitioner (Hematology and Oncology)  DIAGNOSIS:  Encounter Diagnosis  Name Primary?  . Malignant neoplasm of lower-outer quadrant of right breast of female, estrogen receptor positive ([place])     SUMMARY OF ONCOLOGIC HISTORY:   Breast cancer of lower-outer quadrant of right female breast ([place])   05/03/2015 Mammogram    Right breast tomo: Distortion at 8:30 position: 1.3 x 1.5 x 1.6 cm by ultrasound, additional mass superficial 9 mm size, low density benign-appearing      05/03/2015 Initial Diagnosis    Right breast biopsy 8:30 position: Invasive ductal carcinoma with perineural invasion, grade 1, ER 100%, PR 40%, Ki-67 20%, HER-2 negative ratio 1.26      05/03/2015 Clinical Stage    Stage IA: T1c N0      06/17/2015 Surgery    Rt Lumpectomy: IDC with DCIS 1.5 cm , 0/3 LN Neg, grade 2, ER 100%, PR 40%, Ki-67 20%, HER-2 negative ratio 1.26, margins neg      06/17/2015 Pathologic Stage    Stage IA: T1c N0      06/17/2015 Oncotype testing    RS 19 (12% ROR)      07/31/2015 - 08/21/2015 Radiation Therapy    Adjuvant radiation therapy: Right breast/ 42.72 Gy at 2.67 Gy per fraction x 21 fractions.       09/06/2015 -  Anti-estrogen oral therapy    Tamoxifen 20 mg daily.  Planned duration of therapy 5 years.      10/03/2015 Survivorship    SCP visit completed       CHIEF COMPLIANT: Follow-up on tamoxifen therapy  INTERVAL HISTORY: Karen Roth is a 66 year old with above-mentioned history of right breast cancer treated with lumpectomy followed by adjuvant radiation and is currently on tamoxifen. Initially she had a lot of musculoskeletal aches and pains and  fatigue. Symptoms have improved and she is able to maintain on the 20 mg daily. Denies any lumps or nodules. She has intermittent breast pain which he describes as sharp pain.  REVIEW OF SYSTEMS:   Constitutional: Denies fevers, chills or abnormal weight loss Eyes: Denies blurriness of vision Ears, nose, mouth, throat, and face: Denies mucositis or sore throat Respiratory: Denies cough, dyspnea or wheezes Cardiovascular: Denies palpitation, chest discomfort Gastrointestinal:  Denies nausea, heartburn or change in bowel habits Skin: Denies abnormal skin rashes Lymphatics: Denies new lymphadenopathy or easy bruising Neurological:Denies numbness, tingling or new weaknesses Behavioral/Psych: Mood is stable, no new changes  Extremities: No lower extremity edema Breast: denies any pain or lumps or nodules in either breasts, occasional sharp breast pain in the right breast All other systems were reviewed with the patient and are negative.  I have reviewed the past medical history, past surgical history, social history and family history with the patient and they are unchanged from previous note.  ALLERGIES:  is allergic to doxycycline; lisinopril; and shellfish allergy.  MEDICATIONS:  Current Outpatient Prescriptions  Medication Sig Dispense Refill  . acetaminophen (TYLENOL) 500 MG tablet Take 500-1,000 mg by mouth daily as needed for mild pain or headache. Reported on 08/20/2015    . ALPRAZolam (XANAX) 0.5 MG tablet Take 0.5 mg by mouth 3 (three) times daily as needed for anxiety.     Marland Kitchen amLODipine (  NORVASC) 5 MG tablet Take 5 mg by mouth daily.     . APPLE CIDER VINEGAR PO Take 15 mLs by mouth daily.    Marland Kitchen aspirin EC 81 MG tablet Take 81 mg by mouth daily.     . Calcium Citrate-Vitamin D (CITRACAL + D PO) Take 1 tablet by mouth daily.     . Cholecalciferol (VITAMIN D3) 2000 units capsule Take 2,000 Units by mouth daily.     . cromolyn (OPTICROM) 4 % ophthalmic solution INT 1 GTT INTO OU TWO TO  QID  3  . furosemide (LASIX) 20 MG tablet Take by mouth.    . Multiple Vitamin (MULTI-VITAMINS) TABS Take by mouth.    . Omega-3 1000 MG CAPS Take by mouth.    Marland Kitchen omeprazole (PRILOSEC) 20 MG capsule Take 20 mg by mouth daily as needed (for heartburn or acid reflux). Reported on 07/18/2015    . protein supplement shake (PREMIER PROTEIN) LIQD Take 2 oz by mouth daily as needed (for bone support).    . tamoxifen (NOLVADEX) 20 MG tablet TAKE 1 TABLET BY MOUTH DAILY 90 tablet 3   No current facility-administered medications for this visit.     PHYSICAL EXAMINATION: ECOG PERFORMANCE STATUS: 1 - Symptomatic but completely ambulatory  Vitals:   12/15/16 1117  BP: (!) 143/72  Pulse: 79  Resp: 18  Temp: 98.4 F (36.9 C)  SpO2: 100%   Filed Weights   12/15/16 1117  Weight: 170 lb 1.6 oz (77.2 kg)    GENERAL:alert, no distress and comfortable SKIN: skin color, texture, turgor are normal, no rashes or significant lesions EYES: normal, Conjunctiva are pink and non-injected, sclera clear OROPHARYNX:no exudate, no erythema and lips, buccal mucosa, and tongue normal  NECK: supple, thyroid normal size, non-tender, without nodularity LYMPH:  no palpable lymphadenopathy in the cervical, axillary or inguinal LUNGS: clear to auscultation and percussion with normal breathing effort HEART: regular rate & rhythm and no murmurs and no lower extremity edema ABDOMEN:abdomen soft, non-tender and normal bowel sounds MUSCULOSKELETAL:no cyanosis of digits and no clubbing  NEURO: alert & oriented x 3 with fluent speech, no focal motor/sensory deficits EXTREMITIES: No lower extremity edema BREAST: No palpable masses or nodules in either right or left breasts. No palpable axillary supraclavicular or infraclavicular adenopathy no breast tenderness or nipple discharge. (exam performed in the presence of a chaperone)  LABORATORY DATA:  I have reviewed the data as listed   Chemistry      Component Value  Date/Time   NA 139 06/10/2015 1041   NA 141 05/15/2015 0910   K 3.6 06/10/2015 1041   K 4.3 05/15/2015 0910   CL 103 06/10/2015 1041   CO2 26 06/10/2015 1041   CO2 28 05/15/2015 0910   BUN 18 06/10/2015 1041   BUN 17.7 05/15/2015 0910   CREATININE 1.06 (H) 06/10/2015 1041   CREATININE 1.3 (H) 05/15/2015 0910      Component Value Date/Time   CALCIUM 10.0 06/10/2015 1041   CALCIUM 10.0 05/15/2015 0910   ALKPHOS 60 05/15/2015 0910   AST 19 05/15/2015 0910   ALT 18 05/15/2015 0910   BILITOT <0.30 05/15/2015 0910       Lab Results  Component Value Date   WBC 9.0 06/10/2015   HGB 15.0 06/10/2015   HCT 45.7 06/10/2015   MCV 84.8 06/10/2015   PLT 248 06/10/2015   NEUTROABS 9.5 (H) 05/15/2015    ASSESSMENT & PLAN:  Breast cancer of lower-outer quadrant of right female  breast ([place]) Rt Lumpectomy 06/17/15: IDC with DCIS 1.5 cm , 0/3 LN Neg, T1cN0 (Stage 1 A) grade 1, ER 100%, PR 40%, Ki-67 20%, HER-2 negative ratio 1.26, margins Neg, Oncotype DX score 19, 12% ROR Adjuvant radiation therapy from 07/31/2015 to 08/21/2015  Current Treatment: Adjuvant antiestrogen therapy with Tamoxifen20 mg daily started 09/06/2015 (due to osteopeniaand inability to tolerate bisphosphonates in the past)  Tamoxifen toxicities: 1. Severe hot flashes: Improved 2. musculoskeletal aches and pains : Also improved and patient decided to stay on full tablet of 20 mg daily 3. Severe fatigue: Patient is able to exercise 4 days a week at the gym.  Bone density test which showed a T score of -2 osteopenia which has not changed for the past 6 years   Return to clinic in 1 year for follow-up and surveillance exams   I spent 25 minutes talking to the patient of which more than half was spent in counseling and coordination of care.  No orders of the defined types were placed in this encounter.  The patient has a good understanding of the overall plan. she agrees with it. she will call with any problems  that may develop before the next visit here.   Rulon Eisenmenger, MD 12/15/16

## 2016-12-15 NOTE — Assessment & Plan Note (Signed)
Rt Lumpectomy 06/17/15: IDC with DCIS 1.5 cm , 0/3 LN Neg, T1cN0 (Stage 1 A) grade 1, ER 100%, PR 40%, Ki-67 20%, HER-2 negative ratio 1.26, margins Neg, Oncotype DX score 19, 12% ROR Adjuvant radiation therapy from 07/31/2015 to 08/21/2015  Current Treatment: Adjuvant antiestrogen therapy with Tamoxifen20 mg daily started 09/06/2015 (due to osteopeniaand inability to tolerate bisphosphonates in the past) decrease to 10 mg daily 06/02/2016  Tamoxifen toxicities: 1. Severe hot flashes: Improved 2. musculoskeletal aches and pains : Got worse 3. Severe fatigue  Bone density test which showed a T score of -2 osteopenia which has not changed for the past 6 years   Return to clinic in 1 year for follow-up and surveillance exams

## 2017-02-10 ENCOUNTER — Other Ambulatory Visit: Payer: Self-pay | Admitting: Hematology and Oncology

## 2017-02-10 DIAGNOSIS — C50511 Malignant neoplasm of lower-outer quadrant of right female breast: Secondary | ICD-10-CM

## 2017-03-02 ENCOUNTER — Other Ambulatory Visit: Payer: Self-pay | Admitting: Hematology and Oncology

## 2017-03-02 DIAGNOSIS — Z853 Personal history of malignant neoplasm of breast: Secondary | ICD-10-CM

## 2017-04-28 ENCOUNTER — Other Ambulatory Visit: Payer: Self-pay | Admitting: Hematology and Oncology

## 2017-04-28 ENCOUNTER — Other Ambulatory Visit: Payer: Self-pay

## 2017-04-28 DIAGNOSIS — Z853 Personal history of malignant neoplasm of breast: Secondary | ICD-10-CM

## 2017-04-30 ENCOUNTER — Ambulatory Visit
Admission: RE | Admit: 2017-04-30 | Discharge: 2017-04-30 | Disposition: A | Discharge status: Home / Self Care | Payer: Medicare Other | Source: Ambulatory Visit | Attending: Hematology and Oncology | Admitting: Hematology and Oncology

## 2017-04-30 DIAGNOSIS — Z853 Personal history of malignant neoplasm of breast: Secondary | ICD-10-CM

## 2017-04-30 HISTORY — DX: Personal history of irradiation: Z92.3

## 2017-05-19 ENCOUNTER — Other Ambulatory Visit: Payer: Self-pay | Admitting: Hematology and Oncology

## 2017-05-19 DIAGNOSIS — C50511 Malignant neoplasm of lower-outer quadrant of right female breast: Secondary | ICD-10-CM

## 2017-08-28 ENCOUNTER — Other Ambulatory Visit: Payer: Self-pay | Admitting: Hematology and Oncology

## 2017-08-28 DIAGNOSIS — C50511 Malignant neoplasm of lower-outer quadrant of right female breast: Secondary | ICD-10-CM

## 2017-11-11 ENCOUNTER — Telehealth: Payer: Self-pay | Admitting: Hematology and Oncology

## 2017-11-11 NOTE — Telephone Encounter (Signed)
Called regarding 9/3

## 2017-11-24 IMAGING — US US  BREAST BX W/ LOC DEV 1ST LESION IMG BX SPEC US GUIDE*R*
1 series · 8 of 8 positions shown · non-contrast
Comparison: Previous exam(s).

ADDENDUM:
Pathology reveals Grade I INVASIVE DUCTAL CARCINOMA, PERINEURAL
INVASION IS IDENTIFIED of the Right breast at the 8:30 o'clock
location. This was found to be concordant by Dr. [place] G Alice.
Pathology results were discussed with the patient via telephone. She
reported no problems with the biopsy site and is doing well. Post
biopsy care and instructions were reviewed and questions were
answered. She was encouraged to contact The [REDACTED] with any additional questions and or concerns.
She was referred to [REDACTED] [REDACTED] at [REDACTED] on May 15, 2015.

Pathology result reported by Don Lolito Onacram RN on May 06, 2015.
Patient was discussed in tumor Board on 05/15/2015.
Per Dr. Amimatori, [facility] breast MRI will be obtained to evaluate extent of
disease with respect to the invasive cancer in the outer right
breast.
The additional circumscribed 9 mm mass within the lower outer
quadrant of the right breast, at anterior depth, without sonographic
correlate, may be better characterized on this MRI. This lesion is
very superficial and may be localized to the skin. Due to its
superficial position, it does not appear amenable to
stereotactic-guided biopsy and may eventually require wire
localization for surgical excision (if not suggestive of benign
etiology on upcoming MRI).
CLINICAL DATA: Right breast 8:30 o'clock mass.
EXAM:
ULTRASOUND GUIDED RIGHT BREAST CORE NEEDLE BIOPSY

[Series 1: us breast bx w/ loc dev 1st lesion img bx spec us  · 0.06mm/px · 8 of 8 slices shown]
[im 1/8]
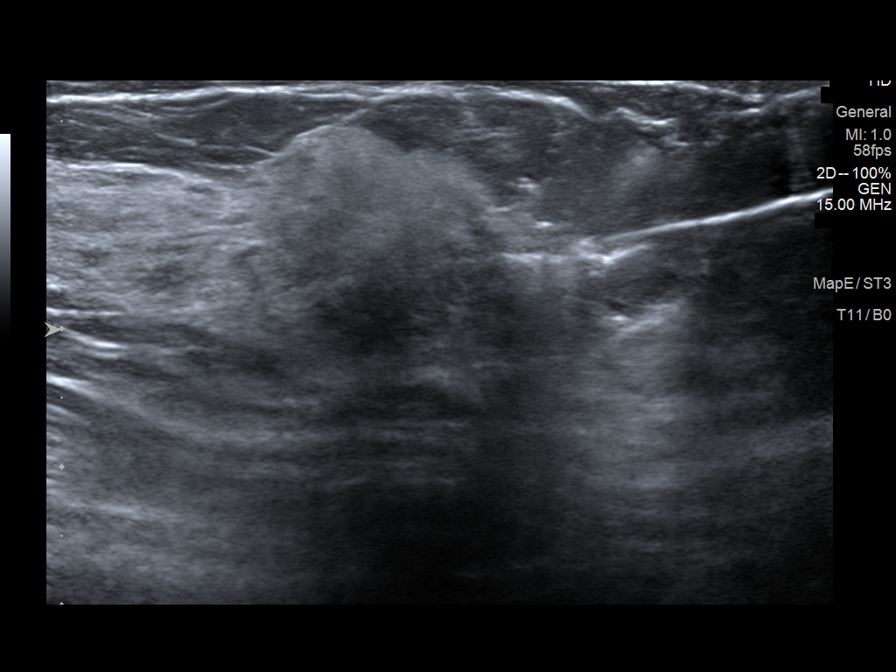
[im 2/8]
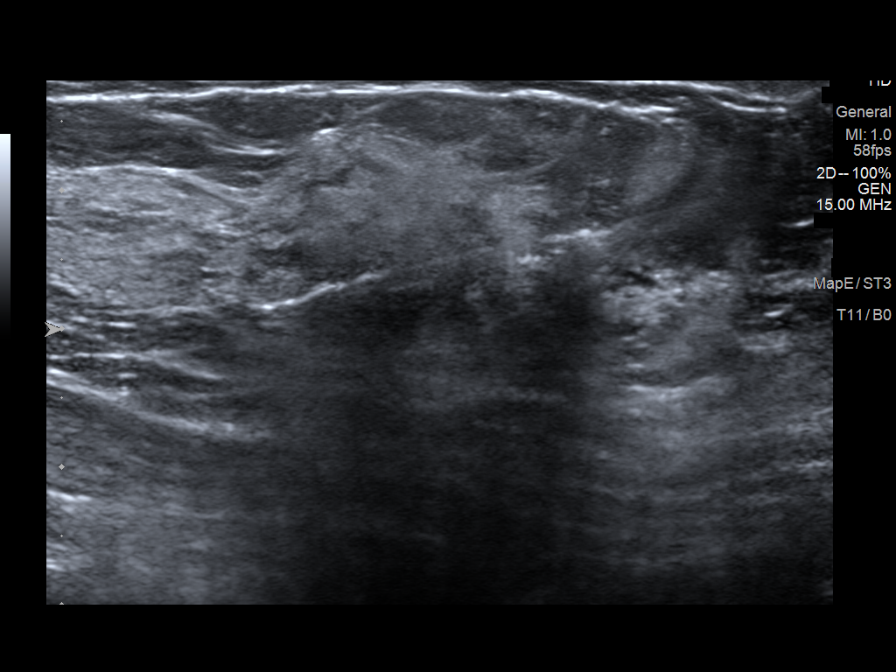
[im 3/8]
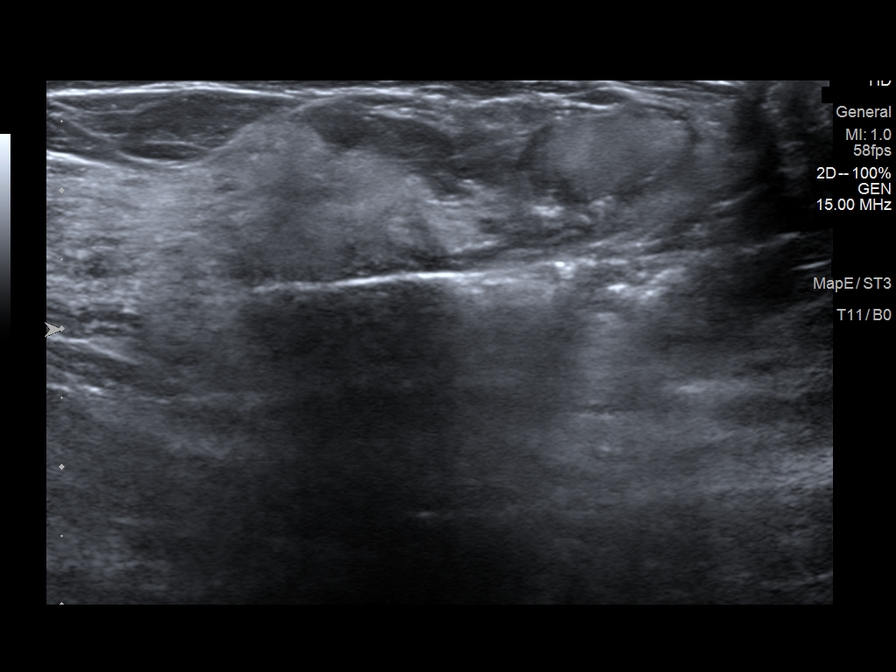
[im 4/8]
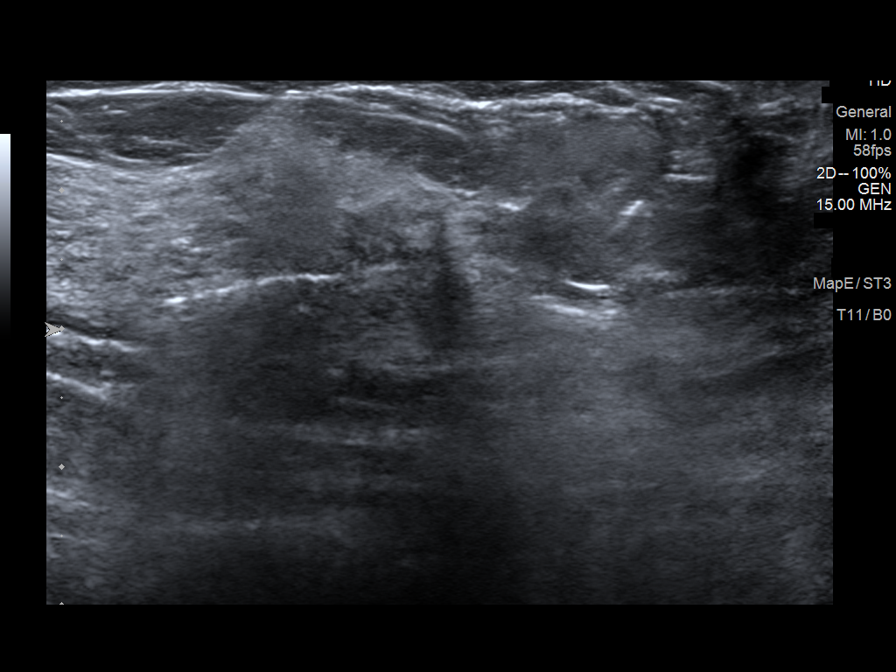
[im 5/8]
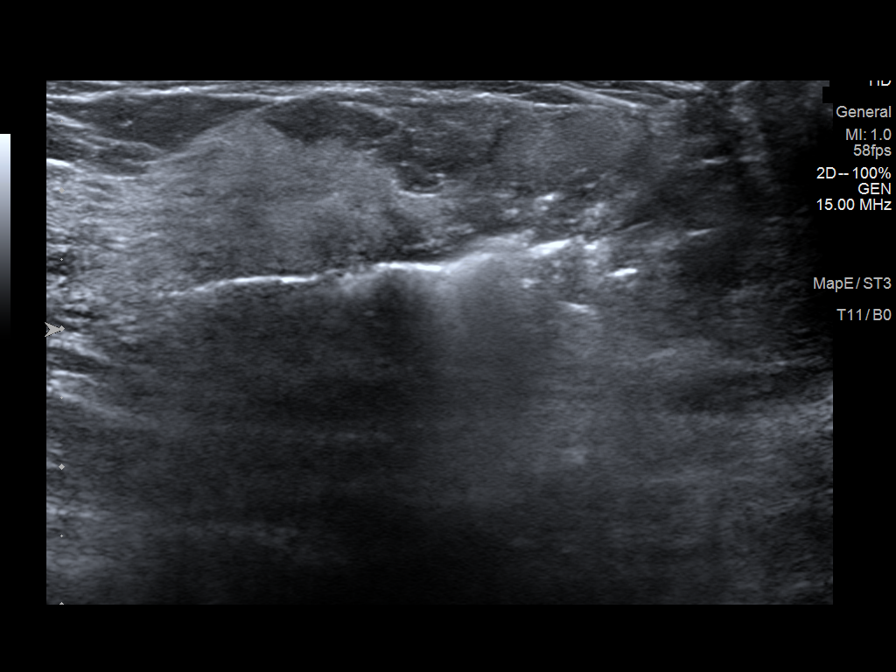
[im 6/8]
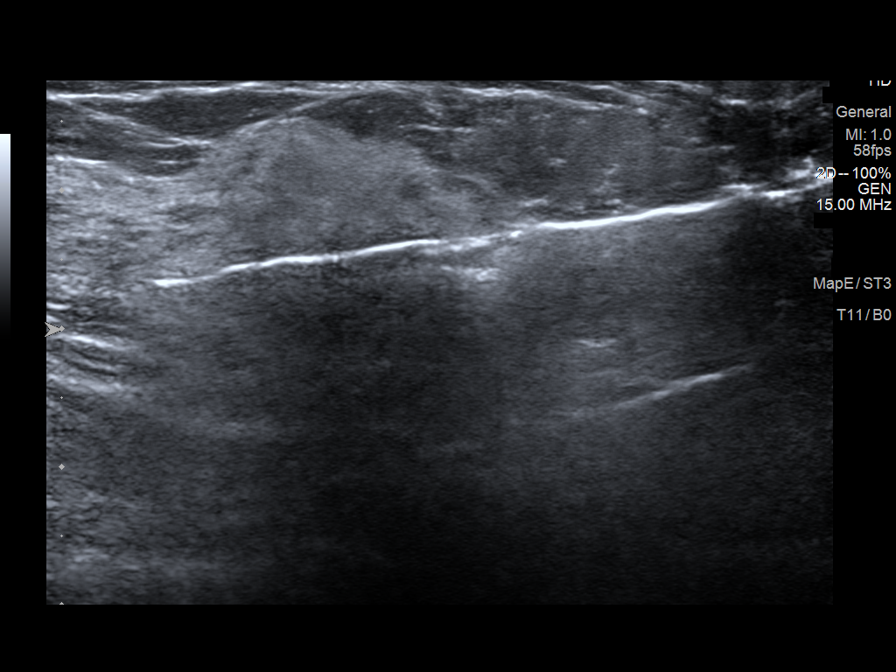
[im 7/8]
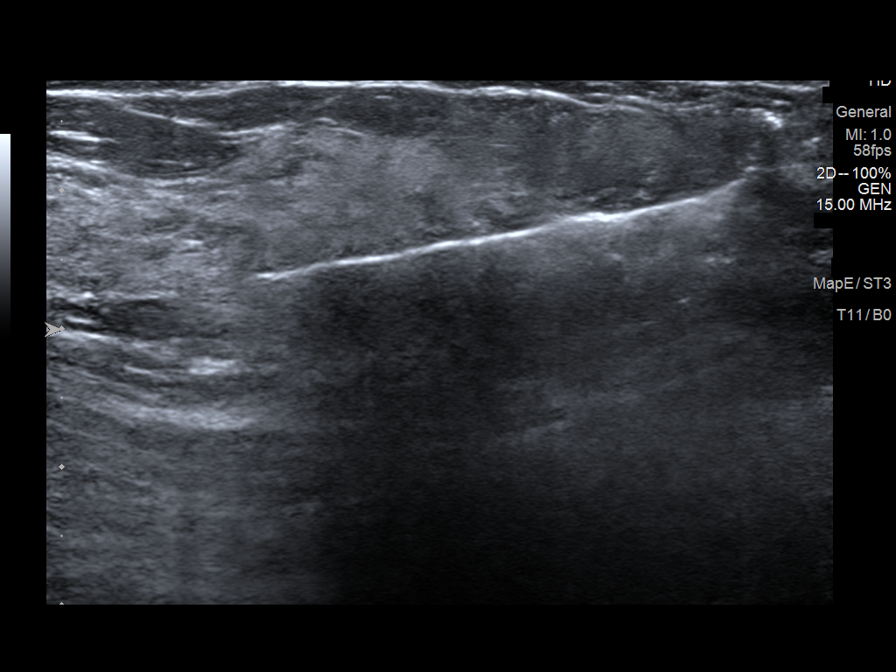
[im 8/8]
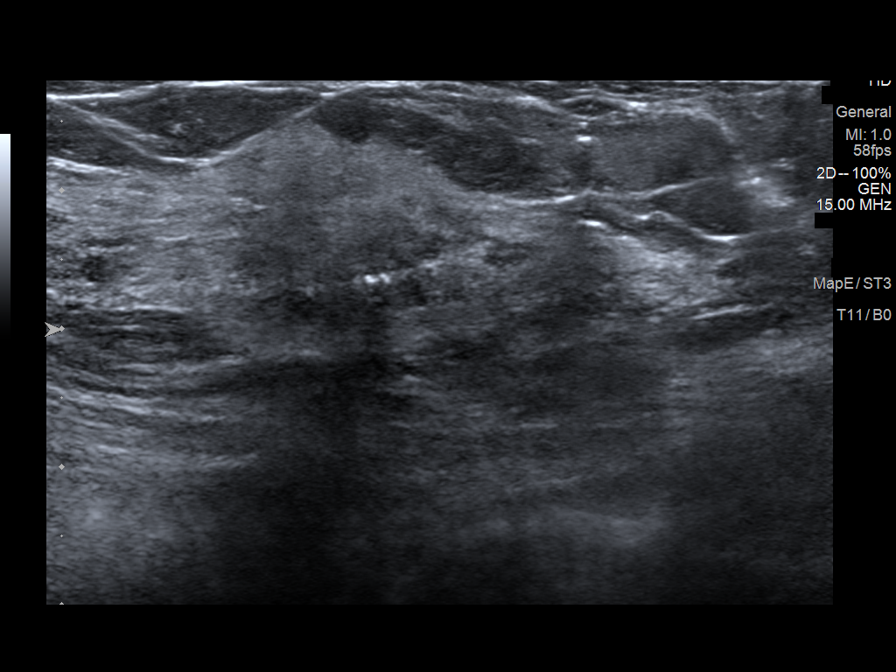

[8 of 8 positions shown; findings below may reference images not displayed]



Using sterile technique and 1% Lidocaine as local anesthetic, under
direct ultrasound visualization, a 14 gauge Hemel Dies device was
used to perform biopsy of right breast 8:30 o'clock mass using a
inferior approach. At the conclusion of the procedure a heart shaped
tissue marker clip was deployed into the biopsy cavity. Follow up 2
view mammogram was performed and dictated separately.
IMPRESSION: Ultrasound guided biopsy of right breast 8:30 o'clock mass. No
apparent complications.

## 2017-12-08 ENCOUNTER — Telehealth: Payer: Self-pay

## 2017-12-08 NOTE — Telephone Encounter (Signed)
Patient called to get her appointment verify. Per 8/21 voice message return

## 2017-12-15 ENCOUNTER — Ambulatory Visit: Payer: Medicare Other | Admitting: Hematology and Oncology

## 2017-12-19 ENCOUNTER — Other Ambulatory Visit: Payer: Self-pay | Admitting: Hematology and Oncology

## 2017-12-19 DIAGNOSIS — C50511 Malignant neoplasm of lower-outer quadrant of right female breast: Secondary | ICD-10-CM

## 2017-12-21 ENCOUNTER — Inpatient Hospital Stay: Payer: Medicare Other | Attending: Hematology and Oncology | Admitting: Hematology and Oncology

## 2017-12-21 ENCOUNTER — Telehealth: Payer: Self-pay | Admitting: Hematology and Oncology

## 2017-12-21 DIAGNOSIS — C50511 Malignant neoplasm of lower-outer quadrant of right female breast: Secondary | ICD-10-CM | POA: Diagnosis present

## 2017-12-21 DIAGNOSIS — R5383 Other fatigue: Secondary | ICD-10-CM | POA: Diagnosis not present

## 2017-12-21 DIAGNOSIS — N951 Menopausal and female climacteric states: Secondary | ICD-10-CM

## 2017-12-21 DIAGNOSIS — Z79810 Long term (current) use of selective estrogen receptor modulators (SERMs): Secondary | ICD-10-CM | POA: Diagnosis not present

## 2017-12-21 DIAGNOSIS — Z923 Personal history of irradiation: Secondary | ICD-10-CM | POA: Diagnosis not present

## 2017-12-21 DIAGNOSIS — Z170 Estrogen receptor positive status [ER+]: Secondary | ICD-10-CM | POA: Diagnosis not present

## 2017-12-21 DIAGNOSIS — M7918 Myalgia, other site: Secondary | ICD-10-CM

## 2017-12-21 DIAGNOSIS — M8580 Other specified disorders of bone density and structure, unspecified site: Secondary | ICD-10-CM

## 2017-12-21 MED ORDER — PRESERVISION AREDS 2 PO CAPS: 1.0000 | ORAL_CAPSULE | Freq: Two times a day (BID) | ORAL | Status: AC

## 2017-12-21 MED ORDER — TAMOXIFEN CITRATE 20 MG PO TABS: 10.0000 mg | ORAL_TABLET | Freq: Every day | ORAL | 0 refills | Status: DC

## 2017-12-21 NOTE — Assessment & Plan Note (Signed)
Rt Lumpectomy 06/17/15: IDC with DCIS 1.5 cm , 0/3 LN Neg, T1cN0 (Stage 1 A) grade 1, ER 100%, PR 40%, Ki-67 20%, HER-2 negative ratio 1.26, margins Neg, Oncotype DX score 19, 12% ROR Adjuvant radiation therapy from 07/31/2015 to 08/21/2015  Current Treatment: Adjuvant antiestrogen therapy with Tamoxifen20 mg daily started 09/06/2015 (due to osteopeniaand inability to tolerate bisphosphonates in the past)  Tamoxifen toxicities: 1. Severe hot flashes: Improved 2. musculoskeletal aches and pains : Also improved and patient decided to stay on full tablet of 20 mg daily 3.Severe fatigue: Patient is able to exercise 4 days a week at the gym.  Bone density test which showed a T score of -2 osteopenia which has not changed for the past 6 years   Return to clinic in 1 year for follow-up and surveillance exams

## 2017-12-21 NOTE — Telephone Encounter (Signed)
Gave pt avs and calendar  °

## 2017-12-21 NOTE — Progress Notes (Signed)
Patient Care Team: Donald Prose, MD as PCP - General (Family Medicine) Jovita Kussmaul, MD as Consulting Physician (General Surgery) Nicholas Lose, MD as Consulting Physician (Hematology and Oncology) Thea Silversmith, MD as Consulting Physician (Radiation Oncology) Sylvan Cheese, NP as Nurse Practitioner (Hematology and Oncology)  DIAGNOSIS:  Encounter Diagnoses  Name Primary?  . 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 Breast cancer of lower-outer quadrant of right female breast ([place])     SUMMARY OF ONCOLOGIC HISTORY:   Breast cancer of lower-outer quadrant of right female breast ([place])   05/03/2015 Mammogram    Right breast tomo: Distortion at 8:30 position: 1.3 x 1.5 x 1.6 cm by ultrasound, additional mass superficial 9 mm size, low density benign-appearing    05/03/2015 Initial Diagnosis    Right breast biopsy 8:30 position: Invasive ductal carcinoma with perineural invasion, grade 1, ER 100%, PR 40%, Ki-67 20%, HER-2 negative ratio 1.26    05/03/2015 Clinical Stage    Stage IA: T1c N0    06/17/2015 Surgery    Rt Lumpectomy: IDC with DCIS 1.5 cm , 0/3 LN Neg, grade 2, ER 100%, PR 40%, Ki-67 20%, HER-2 negative ratio 1.26, margins neg    06/17/2015 Pathologic Stage    Stage IA: T1c N0    06/17/2015 Oncotype testing    RS 19 (12% ROR)    07/31/2015 - 08/21/2015 Radiation Therapy    Adjuvant radiation therapy: Right breast/ 42.72 Gy at 2.67 Gy per fraction x 21 fractions.     09/06/2015 -  Anti-estrogen oral therapy    Tamoxifen 20 mg daily.  Planned duration of therapy 5 years.    10/03/2015 Survivorship    SCP visit completed     CHIEF COMPLIANT: Follow-up on tamoxifen therapy  INTERVAL HISTORY: Karen Roth is a 67 year old with above-mentioned history of right breast cancer treated with lumpectomy and radiation is currently on tamoxifen therapy.   she has been on it for the past 2 and 1/2  years.  She is complaining of hot flashes along with arthralgias myalgias as well as itching of the skin.  She has not been taking the tamoxifen regularly at the full dose.  She is may be taking it every other day or sometimes half a tablet a day.  REVIEW OF SYSTEMS:   Constitutional: Denies fevers, chills or abnormal weight loss Eyes: Denies blurriness of vision Ears, nose, mouth, throat, and face: Denies mucositis or sore throat Respiratory: Denies cough, dyspnea or wheezes Cardiovascular: Denies palpitation, chest discomfort Gastrointestinal:  Denies nausea, heartburn or change in bowel habits Skin: Itching of the skin Lymphatics: Denies new lymphadenopathy or easy bruising Neurological:Denies numbness, tingling or new weaknesses Behavioral/Psych: Mood is stable, no new changes  Extremities: No lower extremity edema Breast:  denies any pain or lumps or nodules in either breasts All other systems were reviewed with the patient and are negative.  I have reviewed the past medical history, past surgical history, social history and family history with the patient and they are unchanged from previous note.  ALLERGIES:  is allergic to doxycycline; lisinopril; and shellfish allergy.  MEDICATIONS:  Current Outpatient Medications  Medication Sig Dispense Refill  . acetaminophen (TYLENOL) 500 MG tablet Take 500-1,000 mg by mouth daily as needed for mild pain or headache. Reported on 08/20/2015    . ALPRAZolam (XANAX) 0.5 MG tablet Take 0.5 mg by mouth 3 (three) times daily as needed for anxiety.     Marland Kitchen  amLODipine (NORVASC) 5 MG tablet Take 5 mg by mouth daily.     . APPLE CIDER VINEGAR PO Take 15 mLs by mouth daily.    Marland Kitchen aspirin EC 81 MG tablet Take 81 mg by mouth daily.     . Calcium Citrate-Vitamin D (CITRACAL + D PO) Take 1 tablet by mouth daily.     . Cholecalciferol (VITAMIN D3) 2000 units capsule Take 2,000 Units by mouth daily.     . cromolyn (OPTICROM) 4 % ophthalmic solution INT 1 GTT  INTO OU TWO TO QID  3  . Multiple Vitamin (MULTI-VITAMINS) TABS Take by mouth.    . Multiple Vitamins-Minerals (PRESERVISION AREDS 2) CAPS Take 1 capsule by mouth 2 (two) times daily.    . Omega-3 1000 MG CAPS Take by mouth.    Marland Kitchen omeprazole (PRILOSEC) 20 MG capsule Take 20 mg by mouth daily as needed (for heartburn or acid reflux). Reported on 07/18/2015    . protein supplement shake (PREMIER PROTEIN) LIQD Take 2 oz by mouth daily as needed (for bone support).    . tamoxifen (NOLVADEX) 20 MG tablet Take 0.5 tablets (10 mg total) by mouth daily. 90 tablet 0  . timolol (BETIMOL) 0.25 % ophthalmic solution Place 1-2 drops into the left eye 2 (two) times daily. 10 mL 12   No current facility-administered medications for this visit.     PHYSICAL EXAMINATION: ECOG PERFORMANCE STATUS: 1 - Symptomatic but completely ambulatory  Vitals:   12/21/17 1103  BP: 136/83  Pulse: 70  Resp: 18  Temp: 98.4 F (36.9 C)  SpO2: 100%   Filed Weights   12/21/17 1103  Weight: 173 lb 9.6 oz (78.7 kg)    GENERAL:alert, no distress and comfortable SKIN: skin color, texture, turgor are normal, no rashes or significant lesions EYES: normal, Conjunctiva are pink and non-injected, sclera clear OROPHARYNX:no exudate, no erythema and lips, buccal mucosa, and tongue normal  NECK: supple, thyroid normal size, non-tender, without nodularity LYMPH:  no palpable lymphadenopathy in the cervical, axillary or inguinal LUNGS: clear to auscultation and percussion with normal breathing effort HEART: regular rate & rhythm and no murmurs and no lower extremity edema ABDOMEN:abdomen soft, non-tender and normal bowel sounds MUSCULOSKELETAL:no cyanosis of digits and no clubbing  NEURO: alert & oriented x 3 with fluent speech, no focal motor/sensory deficits EXTREMITIES: No lower extremity edema BREAST: No palpable masses or nodules in either right or left breasts. No palpable axillary supraclavicular or infraclavicular  adenopathy no breast tenderness or nipple discharge. (exam performed in the presence of a chaperone)  LABORATORY DATA:  I have reviewed the data as listed CMP Latest Ref Rng & Units 06/10/2015 05/15/2015 01/06/2011  Glucose 65 - 99 mg/dL 98 68(L) 90  BUN 6 - 20 mg/dL 18 17.7 10  Creatinine 0.44 - 1.00 mg/dL 1.06(H) 1.3(H) 0.80  Sodium 135 - 145 mmol/L 139 141 143  Potassium 3.5 - 5.1 mmol/L 3.6 4.3 3.5  Chloride 101 - 111 mmol/L 103 - 107  CO2 22 - 32 mmol/L 26 28 31   Calcium 8.9 - 10.3 mg/dL 10.0 10.0 9.4  Total Protein 6.4 - 8.3 g/dL - 7.7 6.6  Total Bilirubin 0.20 - 1.20 mg/dL - <0.30 0.3  Alkaline Phos 40 - 150 U/L - 60 41  AST 5 - 34 U/L - 19 17  ALT 0 - 55 U/L - 18 15    Lab Results  Component Value Date   WBC 9.0 06/10/2015   HGB 15.0 06/10/2015  HCT 45.7 06/10/2015   MCV 84.8 06/10/2015   PLT 248 06/10/2015   NEUTROABS 9.5 (H) 05/15/2015    ASSESSMENT & PLAN:  Breast cancer of lower-outer quadrant of right female breast ([place]) Rt Lumpectomy 06/17/15: IDC with DCIS 1.5 cm , 0/3 LN Neg, T1cN0 (Stage 1 A) grade 1, ER 100%, PR 40%, Ki-67 20%, HER-2 negative ratio 1.26, margins Neg, Oncotype DX score 19, 12% ROR Adjuvant radiation therapy from 07/31/2015 to 08/21/2015  Current Treatment: Adjuvant antiestrogen therapy with Tamoxifen20 mg daily started 09/06/2015 (due to osteopeniaand inability to tolerate bisphosphonates in the past), decrease the dosage to 10 mg a day 12/21/2017 due to side effects  Tamoxifen toxicities: 1. Severe hot flashes: Although they have improved she still experiences hot flashes 2. musculoskeletal aches and pains : Markedly worsen so she will cut down the dosage to 10 mg a day 3.Severe fatigue: Patient is able to exercise 4 days a week at the gym.  She seems to do better at the 10 mg dosage of tamoxifen.  Bone density test which showed a T score of -2 osteopenia which has not changed for the past 6 years  Breast cancer surveillance: 1.  Breast  exam no palpable lumps or nodules scar tissue was felt right axilla 2. mammograms January 2019 were benign  Return to clinic in 1 year for follow-up and surveillance exams    No orders of the defined types were placed in this encounter.  The patient has a good understanding of the overall plan. she agrees with it. she will call with any problems that may develop before the next visit here.   Harriette Ohara, MD 12/21/17

## 2018-01-12 ENCOUNTER — Encounter (INDEPENDENT_AMBULATORY_CARE_PROVIDER_SITE_OTHER): Payer: Medicare Other | Admitting: Ophthalmology

## 2018-01-12 DIAGNOSIS — H353132 Nonexudative age-related macular degeneration, bilateral, intermediate dry stage: Secondary | ICD-10-CM

## 2018-01-12 DIAGNOSIS — H43813 Vitreous degeneration, bilateral: Secondary | ICD-10-CM | POA: Diagnosis not present

## 2018-01-12 DIAGNOSIS — H35033 Hypertensive retinopathy, bilateral: Secondary | ICD-10-CM

## 2018-01-12 DIAGNOSIS — H2512 Age-related nuclear cataract, left eye: Secondary | ICD-10-CM

## 2018-01-12 DIAGNOSIS — I10 Essential (primary) hypertension: Secondary | ICD-10-CM | POA: Diagnosis not present

## 2018-03-24 ENCOUNTER — Other Ambulatory Visit: Payer: Self-pay | Admitting: Hematology and Oncology

## 2018-03-24 DIAGNOSIS — Z853 Personal history of malignant neoplasm of breast: Secondary | ICD-10-CM

## 2018-05-04 ENCOUNTER — Ambulatory Visit
Admission: RE | Admit: 2018-05-04 | Discharge: 2018-05-04 | Disposition: A | Discharge status: Home / Self Care | Payer: Medicare Other | Source: Ambulatory Visit | Attending: Hematology and Oncology | Admitting: Hematology and Oncology

## 2018-05-04 DIAGNOSIS — Z853 Personal history of malignant neoplasm of breast: Secondary | ICD-10-CM

## 2018-06-23 ENCOUNTER — Other Ambulatory Visit: Payer: Self-pay | Admitting: Hematology and Oncology

## 2018-06-23 DIAGNOSIS — C50511 Malignant neoplasm of lower-outer quadrant of right female breast: Secondary | ICD-10-CM

## 2018-12-19 NOTE — Progress Notes (Signed)
Patient Care Team: Donald Prose, MD as PCP - General (Family Medicine) Jovita Kussmaul, MD as Consulting Physician (General Surgery) Nicholas Lose, MD as Consulting Physician (Hematology and Oncology) Thea Silversmith, MD as Consulting Physician (Radiation Oncology) Sylvan Cheese, NP as Nurse Practitioner (Hematology and Oncology)  DIAGNOSIS:    ICD-10-CM   1. Breast cancer of lower-outer quadrant of right female breast ([place])  C50.511 tamoxifen (NOLVADEX) 20 MG tablet    SUMMARY OF ONCOLOGIC HISTORY: Oncology History  Breast cancer of lower-outer quadrant of right female breast ([place])  05/03/2015 Mammogram   Right breast tomo: Distortion at 8:30 position: 1.3 x 1.5 x 1.6 cm by ultrasound, additional mass superficial 9 mm size, low density benign-appearing   05/03/2015 Initial Diagnosis   Right breast biopsy 8:30 position: Invasive ductal carcinoma with perineural invasion, grade 1, ER 100%, PR 40%, Ki-67 20%, HER-2 negative ratio 1.26   05/03/2015 Clinical Stage   Stage IA: T1c N0   06/17/2015 Surgery   Rt Lumpectomy: IDC with DCIS 1.5 cm , 0/3 LN Neg, grade 2, ER 100%, PR 40%, Ki-67 20%, HER-2 negative ratio 1.26, margins neg   06/17/2015 Pathologic Stage   Stage IA: T1c N0   06/17/2015 Oncotype testing   RS 19 (12% ROR)   07/31/2015 - 08/21/2015 Radiation Therapy   Adjuvant radiation therapy: Right breast/ 42.72 Gy at 2.67 Gy per fraction x 21 fractions.    09/06/2015 -  Anti-estrogen oral therapy   Tamoxifen 10 mg daily.  Planned duration of therapy 5 years.   10/03/2015 Survivorship   SCP visit completed     CHIEF COMPLIANT: Follow-up of right breast cancer on tamoxifen therapy  INTERVAL HISTORY: Karen Roth is a 68 y.o. with above-mentioned history of right breast cancer treated with lumpectomy, radiation, and who is currently on tamoxifen therapy. I last saw her a year ago. Mammogram on 05/04/18 showed no evidence of malignancy bilaterally. She presents  to the clinic today for annual follow-up.   REVIEW OF SYSTEMS:   Constitutional: Denies fevers, chills or abnormal weight loss Eyes: Denies blurriness of vision Ears, nose, mouth, throat, and face: Denies mucositis or sore throat Respiratory: Denies cough, dyspnea or wheezes Cardiovascular: Denies palpitation, chest discomfort Gastrointestinal: Denies nausea, heartburn or change in bowel habits Skin: Denies abnormal skin rashes Lymphatics: Denies new lymphadenopathy or easy bruising Neurological: Denies numbness, tingling or new weaknesses Behavioral/Psych: Mood is stable, no new changes  Extremities: No lower extremity edema Breast: denies any pain or lumps or nodules in either breasts All other systems were reviewed with the patient and are negative.  I have reviewed the past medical history, past surgical history, social history and family history with the patient and they are unchanged from previous note.  ALLERGIES:  is allergic to doxycycline; lisinopril; and shellfish allergy.  MEDICATIONS:  Current Outpatient Medications  Medication Sig Dispense Refill  . acetaminophen (TYLENOL) 500 MG tablet Take 500-1,000 mg by mouth daily as needed for mild pain or headache. Reported on 08/20/2015    . ALPRAZolam (XANAX) 0.5 MG tablet Take 0.5 mg by mouth 3 (three) times daily as needed for anxiety.     Marland Kitchen amLODipine (NORVASC) 5 MG tablet Take 5 mg by mouth daily.     . APPLE CIDER VINEGAR PO Take 15 mLs by mouth daily.    Marland Kitchen aspirin EC 81 MG tablet Take 81 mg by mouth daily.     . Bacillus Coagulans-Inulin (ALIGN PREBIOTIC-PROBIOTIC) 5-1.25 MG-GM CHEW Chew 1 tablet  by mouth daily.    . Calcium Citrate-Vitamin D (CITRACAL + D PO) Take 1 tablet by mouth daily.     . Cholecalciferol (VITAMIN D3) 2000 units capsule Take 2,000 Units by mouth daily.     . cromolyn (OPTICROM) 4 % ophthalmic solution INT 1 GTT INTO OU TWO TO QID  3  . Multiple Vitamin (MULTI-VITAMINS) TABS Take by mouth.    .  Multiple Vitamins-Minerals (PRESERVISION AREDS 2) CAPS Take 1 capsule by mouth 2 (two) times daily.    . Omega-3 1000 MG CAPS Take by mouth.    Marland Kitchen omeprazole (PRILOSEC) 20 MG capsule Take 20 mg by mouth daily as needed (for heartburn or acid reflux). Reported on 07/18/2015    . protein supplement shake (PREMIER PROTEIN) LIQD Take 2 oz by mouth daily as needed (for bone support).    . tamoxifen (NOLVADEX) 20 MG tablet Take 0.5 tablets (10 mg total) by mouth daily. 90 tablet 0  . timolol (BETIMOL) 0.25 % ophthalmic solution Place 1-2 drops into the left eye 2 (two) times daily. 10 mL 12  . Turmeric (QC TUMERIC COMPLEX) 500 MG CAPS Take 2,000 mg by mouth daily.     No current facility-administered medications for this visit.     PHYSICAL EXAMINATION: ECOG PERFORMANCE STATUS: 0 - Asymptomatic  Vitals:   12/20/18 1124  BP: (!) 149/84  Pulse: 80  Resp: 19  Temp: 97.8 F (36.6 C)  SpO2: 100%   Filed Weights   12/20/18 1124  Weight: 180 lb (81.6 kg)    GENERAL: alert, no distress and comfortable SKIN: skin color, texture, turgor are normal, no rashes or significant lesions EYES: normal, Conjunctiva are pink and non-injected, sclera clear OROPHARYNX: no exudate, no erythema and lips, buccal mucosa, and tongue normal  NECK: supple, thyroid normal size, non-tender, without nodularity LYMPH: no palpable lymphadenopathy in the cervical, axillary or inguinal LUNGS: clear to auscultation and percussion with normal breathing effort HEART: regular rate & rhythm and no murmurs and no lower extremity edema ABDOMEN: abdomen soft, non-tender and normal bowel sounds MUSCULOSKELETAL: no cyanosis of digits and no clubbing  NEURO: alert & oriented x 3 with fluent speech, no focal motor/sensory deficits EXTREMITIES: No lower extremity edema BREAST: No palpable masses or nodules in either right or left breasts. No palpable axillary supraclavicular or infraclavicular adenopathy no breast tenderness or  nipple discharge. (exam performed in the presence of a chaperone)  LABORATORY DATA:  I have reviewed the data as listed CMP Latest Ref Rng & Units 06/10/2015 05/15/2015 01/06/2011  Glucose 65 - 99 mg/dL 98 68(L) 90  BUN 6 - 20 mg/dL 18 17.7 10  Creatinine 0.44 - 1.00 mg/dL 1.06(H) 1.3(H) 0.80  Sodium 135 - 145 mmol/L 139 141 143  Potassium 3.5 - 5.1 mmol/L 3.6 4.3 3.5  Chloride 101 - 111 mmol/L 103 - 107  CO2 22 - 32 mmol/L 26 28 31   Calcium 8.9 - 10.3 mg/dL 10.0 10.0 9.4  Total Protein 6.4 - 8.3 g/dL - 7.7 6.6  Total Bilirubin 0.20 - 1.20 mg/dL - <0.30 0.3  Alkaline Phos 40 - 150 U/L - 60 41  AST 5 - 34 U/L - 19 17  ALT 0 - 55 U/L - 18 15    Lab Results  Component Value Date   WBC 9.0 06/10/2015   HGB 15.0 06/10/2015   HCT 45.7 06/10/2015   MCV 84.8 06/10/2015   PLT 248 06/10/2015   NEUTROABS 9.5 (H) 05/15/2015  ASSESSMENT & PLAN:  Breast cancer of lower-outer quadrant of right female breast ([place]) Rt Lumpectomy 06/17/15: IDC with DCIS 1.5 cm , 0/3 LN Neg, T1cN0 (Stage 1 A) grade 1, ER 100%, PR 40%, Ki-67 20%, HER-2 negative ratio 1.26, margins Neg, Oncotype DX score 19, 12% ROR Adjuvant radiation therapy from 07/31/2015 to 08/21/2015  Current Treatment: Adjuvant antiestrogen therapy with Tamoxifen20 mg daily started 09/06/2015 (due to osteopeniaand inability to tolerate bisphosphonates in the past)  Tamoxifen toxicities: 1. Severe hot flashes: Improved 2. musculoskeletal aches and pains : Much improved with turmeric as well as taking 10 mg daily.    Rotator cuff tendinitis: Getting physical therapy  Bone density test which showed a T score of -2 osteopenia which has not changed for the past 6 years   Return to clinic in 1 year for follow-up and surveillance exams    No orders of the defined types were placed in this encounter.  The patient has a good understanding of the overall plan. she agrees with it. she will call with any problems that may develop before  the next visit here.  Nicholas Lose, MD 12/20/2018  Julious Oka Dorshimer am acting as scribe for Dr. Nicholas Lose.  I have reviewed the above documentation for accuracy and completeness, and I agree with the above.

## 2018-12-20 ENCOUNTER — Inpatient Hospital Stay: Payer: Medicare Other | Attending: Hematology and Oncology | Admitting: Hematology and Oncology

## 2018-12-20 ENCOUNTER — Other Ambulatory Visit: Payer: Self-pay

## 2018-12-20 DIAGNOSIS — Z170 Estrogen receptor positive status [ER+]: Secondary | ICD-10-CM | POA: Diagnosis not present

## 2018-12-20 DIAGNOSIS — Z923 Personal history of irradiation: Secondary | ICD-10-CM | POA: Diagnosis not present

## 2018-12-20 DIAGNOSIS — C50511 Malignant neoplasm of lower-outer quadrant of right female breast: Secondary | ICD-10-CM | POA: Insufficient documentation

## 2018-12-20 DIAGNOSIS — M8580 Other specified disorders of bone density and structure, unspecified site: Secondary | ICD-10-CM | POA: Insufficient documentation

## 2018-12-20 DIAGNOSIS — Z79810 Long term (current) use of selective estrogen receptor modulators (SERMs): Secondary | ICD-10-CM | POA: Insufficient documentation

## 2018-12-20 MED ORDER — TAMOXIFEN CITRATE 20 MG PO TABS: 10.0000 mg | ORAL_TABLET | Freq: Every day | ORAL | 0 refills | Status: DC

## 2018-12-20 MED ORDER — TURMERIC 500 MG PO CAPS: 2000.0000 mg | ORAL_CAPSULE | Freq: Every day | ORAL | Status: AC

## 2018-12-20 MED ORDER — ALIGN PREBIOTIC-PROBIOTIC 5-1.25 MG-GM PO CHEW: 1.0000 | CHEWABLE_TABLET | Freq: Every day | ORAL | Status: DC

## 2018-12-20 NOTE — Assessment & Plan Note (Signed)
Rt Lumpectomy 06/17/15: IDC with DCIS 1.5 cm , 0/3 LN Neg, T1cN0 (Stage 1 A) grade 1, ER 100%, PR 40%, Ki-67 20%, HER-2 negative ratio 1.26, margins Neg, Oncotype DX score 19, 12% ROR Adjuvant radiation therapy from 07/31/2015 to 08/21/2015  Current Treatment: Adjuvant antiestrogen therapy with Tamoxifen20 mg daily started 09/06/2015 (due to osteopeniaand inability to tolerate bisphosphonates in the past)  Tamoxifen toxicities: 1. Severe hot flashes: Improved 2. musculoskeletal aches and pains : Much improved with turmeric as well as taking 10 mg daily.    Rotator cuff tendinitis: Getting physical therapy  Bone density test which showed a T score of -2 osteopenia which has not changed for the past 6 years   Return to clinic in 1 year for follow-up and surveillance exams

## 2018-12-21 ENCOUNTER — Telehealth: Payer: Self-pay | Admitting: Hematology and Oncology

## 2018-12-21 NOTE — Telephone Encounter (Signed)
I left a message regarding schedule  

## 2019-01-04 ENCOUNTER — Encounter (INDEPENDENT_AMBULATORY_CARE_PROVIDER_SITE_OTHER): Payer: Medicare Other | Admitting: Ophthalmology

## 2019-01-06 ENCOUNTER — Encounter (INDEPENDENT_AMBULATORY_CARE_PROVIDER_SITE_OTHER): Payer: Medicare Other | Admitting: Ophthalmology

## 2019-01-06 ENCOUNTER — Other Ambulatory Visit: Payer: Self-pay

## 2019-01-06 DIAGNOSIS — H2512 Age-related nuclear cataract, left eye: Secondary | ICD-10-CM

## 2019-01-06 DIAGNOSIS — H35033 Hypertensive retinopathy, bilateral: Secondary | ICD-10-CM

## 2019-01-06 DIAGNOSIS — I10 Essential (primary) hypertension: Secondary | ICD-10-CM | POA: Diagnosis not present

## 2019-01-06 DIAGNOSIS — H43813 Vitreous degeneration, bilateral: Secondary | ICD-10-CM | POA: Diagnosis not present

## 2019-01-06 DIAGNOSIS — H353132 Nonexudative age-related macular degeneration, bilateral, intermediate dry stage: Secondary | ICD-10-CM | POA: Diagnosis not present

## 2019-03-23 ENCOUNTER — Other Ambulatory Visit: Payer: Self-pay | Admitting: Hematology and Oncology

## 2019-03-23 DIAGNOSIS — Z9889 Other specified postprocedural states: Secondary | ICD-10-CM

## 2019-05-12 ENCOUNTER — Other Ambulatory Visit: Payer: Self-pay | Admitting: Nephrology

## 2019-05-12 DIAGNOSIS — N182 Chronic kidney disease, stage 2 (mild): Secondary | ICD-10-CM

## 2019-05-12 DIAGNOSIS — H93A2 Pulsatile tinnitus, left ear: Secondary | ICD-10-CM

## 2019-05-15 ENCOUNTER — Ambulatory Visit
Admission: RE | Admit: 2019-05-15 | Discharge: 2019-05-15 | Disposition: A | Discharge status: Home / Self Care | Payer: Medicare PPO | Source: Ambulatory Visit | Attending: Nephrology | Admitting: Nephrology

## 2019-05-15 DIAGNOSIS — H93A2 Pulsatile tinnitus, left ear: Secondary | ICD-10-CM

## 2019-05-15 DIAGNOSIS — N182 Chronic kidney disease, stage 2 (mild): Secondary | ICD-10-CM

## 2019-05-16 ENCOUNTER — Ambulatory Visit
Admission: RE | Admit: 2019-05-16 | Discharge: 2019-05-16 | Disposition: A | Discharge status: Home / Self Care | Payer: Medicare PPO | Source: Ambulatory Visit | Attending: Hematology and Oncology | Admitting: Hematology and Oncology

## 2019-05-16 ENCOUNTER — Other Ambulatory Visit: Payer: Self-pay

## 2019-05-16 DIAGNOSIS — Z9889 Other specified postprocedural states: Secondary | ICD-10-CM

## 2019-09-04 DIAGNOSIS — H43811 Vitreous degeneration, right eye: Secondary | ICD-10-CM | POA: Diagnosis not present

## 2019-09-04 DIAGNOSIS — H401231 Low-tension glaucoma, bilateral, mild stage: Secondary | ICD-10-CM | POA: Diagnosis not present

## 2019-09-04 DIAGNOSIS — H18513 Endothelial corneal dystrophy, bilateral: Secondary | ICD-10-CM | POA: Diagnosis not present

## 2019-09-04 DIAGNOSIS — H0288A Meibomian gland dysfunction right eye, upper and lower eyelids: Secondary | ICD-10-CM | POA: Diagnosis not present

## 2019-09-04 DIAGNOSIS — H47232 Glaucomatous optic atrophy, left eye: Secondary | ICD-10-CM | POA: Diagnosis not present

## 2019-09-04 DIAGNOSIS — H353132 Nonexudative age-related macular degeneration, bilateral, intermediate dry stage: Secondary | ICD-10-CM | POA: Diagnosis not present

## 2019-09-04 DIAGNOSIS — H0288B Meibomian gland dysfunction left eye, upper and lower eyelids: Secondary | ICD-10-CM | POA: Diagnosis not present

## 2019-09-04 DIAGNOSIS — H16223 Keratoconjunctivitis sicca, not specified as Sjogren's, bilateral: Secondary | ICD-10-CM | POA: Diagnosis not present

## 2019-09-04 DIAGNOSIS — H04123 Dry eye syndrome of bilateral lacrimal glands: Secondary | ICD-10-CM | POA: Diagnosis not present

## 2019-09-12 DIAGNOSIS — C50511 Malignant neoplasm of lower-outer quadrant of right female breast: Secondary | ICD-10-CM | POA: Diagnosis not present

## 2019-11-14 DIAGNOSIS — R7309 Other abnormal glucose: Secondary | ICD-10-CM | POA: Diagnosis not present

## 2019-11-14 DIAGNOSIS — N1831 Chronic kidney disease, stage 3a: Secondary | ICD-10-CM | POA: Diagnosis not present

## 2019-11-14 DIAGNOSIS — K219 Gastro-esophageal reflux disease without esophagitis: Secondary | ICD-10-CM | POA: Diagnosis not present

## 2019-11-14 DIAGNOSIS — M8580 Other specified disorders of bone density and structure, unspecified site: Secondary | ICD-10-CM | POA: Diagnosis not present

## 2019-11-14 DIAGNOSIS — D0511 Intraductal carcinoma in situ of right breast: Secondary | ICD-10-CM | POA: Diagnosis not present

## 2019-11-14 DIAGNOSIS — Z1389 Encounter for screening for other disorder: Secondary | ICD-10-CM | POA: Diagnosis not present

## 2019-11-14 DIAGNOSIS — I129 Hypertensive chronic kidney disease with stage 1 through stage 4 chronic kidney disease, or unspecified chronic kidney disease: Secondary | ICD-10-CM | POA: Diagnosis not present

## 2019-11-14 DIAGNOSIS — Z0000 Encounter for general adult medical examination without abnormal findings: Secondary | ICD-10-CM | POA: Diagnosis not present

## 2019-11-19 ENCOUNTER — Other Ambulatory Visit: Payer: Self-pay | Admitting: Hematology and Oncology

## 2019-11-19 DIAGNOSIS — C50511 Malignant neoplasm of lower-outer quadrant of right female breast: Secondary | ICD-10-CM

## 2019-12-04 DIAGNOSIS — H35033 Hypertensive retinopathy, bilateral: Secondary | ICD-10-CM | POA: Diagnosis not present

## 2019-12-04 DIAGNOSIS — H401231 Low-tension glaucoma, bilateral, mild stage: Secondary | ICD-10-CM | POA: Diagnosis not present

## 2019-12-04 DIAGNOSIS — H353132 Nonexudative age-related macular degeneration, bilateral, intermediate dry stage: Secondary | ICD-10-CM | POA: Diagnosis not present

## 2019-12-04 DIAGNOSIS — H18513 Endothelial corneal dystrophy, bilateral: Secondary | ICD-10-CM | POA: Diagnosis not present

## 2019-12-04 DIAGNOSIS — H25012 Cortical age-related cataract, left eye: Secondary | ICD-10-CM | POA: Diagnosis not present

## 2019-12-04 DIAGNOSIS — H2512 Age-related nuclear cataract, left eye: Secondary | ICD-10-CM | POA: Diagnosis not present

## 2019-12-20 NOTE — Progress Notes (Signed)
Patient Care Team: Donald Prose, MD as PCP - General (Family Medicine) Jovita Kussmaul, MD as Consulting Physician (General Surgery) Nicholas Lose, MD as Consulting Physician (Hematology and Oncology) Thea Silversmith, MD as Consulting Physician (Radiation Oncology) Sylvan Cheese, NP as Nurse Practitioner (Hematology and Oncology)  DIAGNOSIS:    ICD-10-CM   1. Malignant neoplasm of lower-outer quadrant of right breast of female, estrogen receptor positive ([place])  C50.511    Z17.0     SUMMARY OF ONCOLOGIC HISTORY: Oncology History  Breast cancer of lower-outer quadrant of right female breast ([place])  05/03/2015 Mammogram   Right breast tomo: Distortion at 8:30 position: 1.3 x 1.5 x 1.6 cm by ultrasound, additional mass superficial 9 mm size, low density benign-appearing   05/03/2015 Initial Diagnosis   Right breast biopsy 8:30 position: Invasive ductal carcinoma with perineural invasion, grade 1, ER 100%, PR 40%, Ki-67 20%, HER-2 negative ratio 1.26   05/03/2015 Clinical Stage   Stage IA: T1c N0   06/17/2015 Surgery   Rt Lumpectomy: IDC with DCIS 1.5 cm , 0/3 LN Neg, grade 2, ER 100%, PR 40%, Ki-67 20%, HER-2 negative ratio 1.26, margins neg   06/17/2015 Pathologic Stage   Stage IA: T1c N0   06/17/2015 Oncotype testing   RS 19 (12% ROR)   07/31/2015 - 08/21/2015 Radiation Therapy   Adjuvant radiation therapy: Right breast/ 42.72 Gy at 2.67 Gy per fraction x 21 fractions.    09/06/2015 -  Anti-estrogen oral therapy   Tamoxifen 10 mg daily.  Planned duration of therapy 5 years.   10/03/2015 Survivorship   SCP visit completed     CHIEF COMPLIANT: Follow-up of right breast cancer on tamoxifen therapy  INTERVAL HISTORY: Karen Roth is a 69 y.o. with above-mentioned history of right breast cancer treated with lumpectomy, radiation, and who is currently on tamoxifen therapy. Mammogram on 05/16/19 showed no evidence of malignancy bilaterally. She presents to the clinic  today for annual follow-up.  She has been taking 5 mg of tamoxifen and appears to be tolerating it fairly well. She could not tolerate the 10 mg dosage.  ALLERGIES:  is allergic to doxycycline, lisinopril, and shellfish allergy.  MEDICATIONS:  Current Outpatient Medications  Medication Sig Dispense Refill  . acetaminophen (TYLENOL) 500 MG tablet Take 500-1,000 mg by mouth daily as needed for mild pain or headache. Reported on 08/20/2015    . ALPRAZolam (XANAX) 0.5 MG tablet Take 0.5 mg by mouth 3 (three) times daily as needed for anxiety.     Marland Kitchen amLODipine (NORVASC) 5 MG tablet Take 5 mg by mouth daily.     . APPLE CIDER VINEGAR PO Take 15 mLs by mouth daily.    Marland Kitchen aspirin EC 81 MG tablet Take 81 mg by mouth daily.     . Bacillus Coagulans-Inulin (ALIGN PREBIOTIC-PROBIOTIC) 5-1.25 MG-GM CHEW Chew 1 tablet by mouth daily.    . Calcium Citrate-Vitamin D (CITRACAL + D PO) Take 1 tablet by mouth daily.     . Cholecalciferol (VITAMIN D3) 2000 units capsule Take 2,000 Units by mouth daily.     . cromolyn (OPTICROM) 4 % ophthalmic solution INT 1 GTT INTO OU TWO TO QID  3  . Multiple Vitamin (MULTI-VITAMINS) TABS Take by mouth.    . Multiple Vitamins-Minerals (PRESERVISION AREDS 2) CAPS Take 1 capsule by mouth 2 (two) times daily.    . Omega-3 1000 MG CAPS Take by mouth.    Marland Kitchen omeprazole (PRILOSEC) 20 MG capsule Take 20 mg  by mouth daily as needed (for heartburn or acid reflux). Reported on 07/18/2015    . protein supplement shake (PREMIER PROTEIN) LIQD Take 2 oz by mouth daily as needed (for bone support).    . tamoxifen (NOLVADEX) 20 MG tablet TAKE 1 TABLET BY MOUTH DAILY 90 tablet 0  . timolol (BETIMOL) 0.25 % ophthalmic solution Place 1-2 drops into the left eye 2 (two) times daily. 10 mL 12  . Turmeric (QC TUMERIC COMPLEX) 500 MG CAPS Take 2,000 mg by mouth daily.     No current facility-administered medications for this visit.    PHYSICAL EXAMINATION: ECOG PERFORMANCE STATUS: 1 - Symptomatic but  completely ambulatory  Vitals:   12/21/19 0924  BP: (!) 150/81  Pulse: 73  Resp: 17  Temp: (!) 97.1 F (36.2 C)  SpO2: 100%   Filed Weights   12/21/19 0924  Weight: 184 lb 14.4 oz (83.9 kg)     LABORATORY DATA:  I have reviewed the data as listed CMP Latest Ref Rng & Units 06/10/2015 05/15/2015 01/06/2011  Glucose 65 - 99 mg/dL 98 68(L) 90  BUN 6 - 20 mg/dL 18 17.7 10  Creatinine 0.44 - 1.00 mg/dL 1.06(H) 1.3(H) 0.80  Sodium 135 - 145 mmol/L 139 141 143  Potassium 3.5 - 5.1 mmol/L 3.6 4.3 3.5  Chloride 101 - 111 mmol/L 103 - 107  CO2 22 - 32 mmol/L 26 28 31   Calcium 8.9 - 10.3 mg/dL 10.0 10.0 9.4  Total Protein 6.4 - 8.3 g/dL - 7.7 6.6  Total Bilirubin 0.20 - 1.20 mg/dL - <0.30 0.3  Alkaline Phos 40 - 150 U/L - 60 41  AST 5 - 34 U/L - 19 17  ALT 0 - 55 U/L - 18 15    Lab Results  Component Value Date   WBC 9.0 06/10/2015   HGB 15.0 06/10/2015   HCT 45.7 06/10/2015   MCV 84.8 06/10/2015   PLT 248 06/10/2015   NEUTROABS 9.5 (H) 05/15/2015    ASSESSMENT & PLAN:  Breast cancer of lower-outer quadrant of right female breast ([place]) Rt Lumpectomy 06/17/15: IDC with DCIS 1.5 cm , 0/3 LN Neg, T1cN0 (Stage 1 A) grade 1, ER 100%, PR 40%, Ki-67 20%, HER-2 negative ratio 1.26, margins Neg, Oncotype DX score 19, 12% ROR Adjuvant radiation therapy from 07/31/2015 to 08/21/2015  Current Treatment: Adjuvant antiestrogen therapy with Tamoxifen5 mg daily started 09/06/2015 (due to osteopeniaand inability to tolerate bisphosphonates in the past)  Tamoxifen toxicities: 1. Severe hot flashes: Improved 2. musculoskeletal aches and pains : Much improved with turmeric as well as taking 5 mg daily.   Patient has 1 more year of tamoxifen left and after that she will discontinue it.  Rotator cuff tendinitis: Resolved  Breast cancer surveillance: 1.  Breast exam: 12/21/2019: Benign  2. mammogram 05/16/2019: Benign breast density category C 3. bone density test which showed a T score of  -2 osteopenia which has not changed for the past 6 years   Recent falls with twisting of the right ankle: Patient has a brace.  Return to clinic in1 yearfor follow-up and surveillance exams    No orders of the defined types were placed in this encounter.  The patient has a good understanding of the overall plan. she agrees with it. she will call with any problems that may develop before the next visit here.  Total time spent: 20 mins including face to face time and time spent for planning, charting and coordination of care  Ana Woodroof,  Loleta Dicker, MD 12/21/2019  Julious Oka Dorshimer, am acting as scribe for Dr. Nicholas Lose.  I have reviewed the above documentation for accuracy and completeness, and I agree with the above.

## 2019-12-21 ENCOUNTER — Telehealth: Payer: Self-pay | Admitting: Hematology and Oncology

## 2019-12-21 ENCOUNTER — Other Ambulatory Visit: Payer: Self-pay

## 2019-12-21 ENCOUNTER — Inpatient Hospital Stay: Payer: Medicare PPO | Attending: Hematology and Oncology | Admitting: Hematology and Oncology

## 2019-12-21 DIAGNOSIS — Z923 Personal history of irradiation: Secondary | ICD-10-CM | POA: Insufficient documentation

## 2019-12-21 DIAGNOSIS — C50511 Malignant neoplasm of lower-outer quadrant of right female breast: Secondary | ICD-10-CM | POA: Diagnosis not present

## 2019-12-21 DIAGNOSIS — Z79899 Other long term (current) drug therapy: Secondary | ICD-10-CM | POA: Diagnosis not present

## 2019-12-21 DIAGNOSIS — Z79810 Long term (current) use of selective estrogen receptor modulators (SERMs): Secondary | ICD-10-CM | POA: Insufficient documentation

## 2019-12-21 DIAGNOSIS — Z170 Estrogen receptor positive status [ER+]: Secondary | ICD-10-CM | POA: Diagnosis not present

## 2019-12-21 NOTE — Assessment & Plan Note (Signed)
Rt Lumpectomy 06/17/15: IDC with DCIS 1.5 cm , 0/3 LN Neg, T1cN0 (Stage 1 A) grade 1, ER 100%, PR 40%, Ki-67 20%, HER-2 negative ratio 1.26, margins Neg, Oncotype DX score 19, 12% ROR Adjuvant radiation therapy from 07/31/2015 to 08/21/2015  Current Treatment: Adjuvant antiestrogen therapy with Tamoxifen20 mg daily started 09/06/2015 (due to osteopeniaand inability to tolerate bisphosphonates in the past)  Tamoxifen toxicities: 1. Severe hot flashes: Improved 2. musculoskeletal aches and pains : Much improved with turmeric as well as taking 10 mg daily.    Rotator cuff tendinitis: Stable  Breast cancer surveillance: 1.  Breast exam: 12/21/2019: Benign  2. mammogram 05/16/2019: Benign breast density category C 3. bone density test which showed a T score of -2 osteopenia which has not changed for the past 6 years   Return to clinic in1 yearfor follow-up and surveillance exams

## 2019-12-21 NOTE — Telephone Encounter (Signed)
Scheduled appts per 9/2 los. Gave pt a print out of AVS.

## 2020-01-08 ENCOUNTER — Encounter (INDEPENDENT_AMBULATORY_CARE_PROVIDER_SITE_OTHER): Payer: Medicare PPO | Admitting: Ophthalmology

## 2020-01-08 ENCOUNTER — Other Ambulatory Visit: Payer: Self-pay

## 2020-01-08 DIAGNOSIS — H35033 Hypertensive retinopathy, bilateral: Secondary | ICD-10-CM | POA: Diagnosis not present

## 2020-01-08 DIAGNOSIS — H353132 Nonexudative age-related macular degeneration, bilateral, intermediate dry stage: Secondary | ICD-10-CM | POA: Diagnosis not present

## 2020-01-08 DIAGNOSIS — H43813 Vitreous degeneration, bilateral: Secondary | ICD-10-CM

## 2020-01-08 DIAGNOSIS — I10 Essential (primary) hypertension: Secondary | ICD-10-CM

## 2020-02-26 ENCOUNTER — Other Ambulatory Visit: Payer: Self-pay | Admitting: Hematology and Oncology

## 2020-02-26 DIAGNOSIS — C50511 Malignant neoplasm of lower-outer quadrant of right female breast: Secondary | ICD-10-CM

## 2020-03-06 DIAGNOSIS — H401231 Low-tension glaucoma, bilateral, mild stage: Secondary | ICD-10-CM | POA: Diagnosis not present

## 2020-03-06 DIAGNOSIS — H0288A Meibomian gland dysfunction right eye, upper and lower eyelids: Secondary | ICD-10-CM | POA: Diagnosis not present

## 2020-03-06 DIAGNOSIS — H0102A Squamous blepharitis right eye, upper and lower eyelids: Secondary | ICD-10-CM | POA: Diagnosis not present

## 2020-03-06 DIAGNOSIS — H25012 Cortical age-related cataract, left eye: Secondary | ICD-10-CM | POA: Diagnosis not present

## 2020-03-06 DIAGNOSIS — H2512 Age-related nuclear cataract, left eye: Secondary | ICD-10-CM | POA: Diagnosis not present

## 2020-03-06 DIAGNOSIS — H353132 Nonexudative age-related macular degeneration, bilateral, intermediate dry stage: Secondary | ICD-10-CM | POA: Diagnosis not present

## 2020-03-06 DIAGNOSIS — H35033 Hypertensive retinopathy, bilateral: Secondary | ICD-10-CM | POA: Diagnosis not present

## 2020-03-06 DIAGNOSIS — H18513 Endothelial corneal dystrophy, bilateral: Secondary | ICD-10-CM | POA: Diagnosis not present

## 2020-03-26 DIAGNOSIS — C50511 Malignant neoplasm of lower-outer quadrant of right female breast: Secondary | ICD-10-CM | POA: Diagnosis not present

## 2020-04-05 ENCOUNTER — Other Ambulatory Visit: Payer: Self-pay | Admitting: Hematology and Oncology

## 2020-04-05 DIAGNOSIS — Z9889 Other specified postprocedural states: Secondary | ICD-10-CM

## 2020-05-09 DIAGNOSIS — Z6830 Body mass index (BMI) 30.0-30.9, adult: Secondary | ICD-10-CM | POA: Diagnosis not present

## 2020-05-09 DIAGNOSIS — Z779 Other contact with and (suspected) exposures hazardous to health: Secondary | ICD-10-CM | POA: Diagnosis not present

## 2020-05-09 DIAGNOSIS — Z124 Encounter for screening for malignant neoplasm of cervix: Secondary | ICD-10-CM | POA: Diagnosis not present

## 2020-05-17 ENCOUNTER — Other Ambulatory Visit: Payer: Self-pay

## 2020-05-17 ENCOUNTER — Ambulatory Visit
Admission: RE | Admit: 2020-05-17 | Discharge: 2020-05-17 | Disposition: A | Discharge status: Home / Self Care | Payer: Medicare PPO | Source: Ambulatory Visit | Attending: Hematology and Oncology | Admitting: Hematology and Oncology

## 2020-05-17 DIAGNOSIS — R922 Inconclusive mammogram: Secondary | ICD-10-CM | POA: Diagnosis not present

## 2020-05-17 DIAGNOSIS — Z9889 Other specified postprocedural states: Secondary | ICD-10-CM

## 2020-06-04 DIAGNOSIS — H353132 Nonexudative age-related macular degeneration, bilateral, intermediate dry stage: Secondary | ICD-10-CM | POA: Diagnosis not present

## 2020-06-04 DIAGNOSIS — H35033 Hypertensive retinopathy, bilateral: Secondary | ICD-10-CM | POA: Diagnosis not present

## 2020-06-04 DIAGNOSIS — H0102A Squamous blepharitis right eye, upper and lower eyelids: Secondary | ICD-10-CM | POA: Diagnosis not present

## 2020-06-04 DIAGNOSIS — H401231 Low-tension glaucoma, bilateral, mild stage: Secondary | ICD-10-CM | POA: Diagnosis not present

## 2020-06-04 DIAGNOSIS — H18513 Endothelial corneal dystrophy, bilateral: Secondary | ICD-10-CM | POA: Diagnosis not present

## 2020-06-04 DIAGNOSIS — H2512 Age-related nuclear cataract, left eye: Secondary | ICD-10-CM | POA: Diagnosis not present

## 2020-06-04 DIAGNOSIS — H25012 Cortical age-related cataract, left eye: Secondary | ICD-10-CM | POA: Diagnosis not present

## 2020-06-04 DIAGNOSIS — H0288A Meibomian gland dysfunction right eye, upper and lower eyelids: Secondary | ICD-10-CM | POA: Diagnosis not present

## 2020-06-04 DIAGNOSIS — H0288B Meibomian gland dysfunction left eye, upper and lower eyelids: Secondary | ICD-10-CM | POA: Diagnosis not present

## 2020-06-11 DIAGNOSIS — C50919 Malignant neoplasm of unspecified site of unspecified female breast: Secondary | ICD-10-CM | POA: Diagnosis not present

## 2020-06-11 DIAGNOSIS — I129 Hypertensive chronic kidney disease with stage 1 through stage 4 chronic kidney disease, or unspecified chronic kidney disease: Secondary | ICD-10-CM | POA: Diagnosis not present

## 2020-06-11 DIAGNOSIS — H93A2 Pulsatile tinnitus, left ear: Secondary | ICD-10-CM | POA: Diagnosis not present

## 2020-06-11 DIAGNOSIS — N182 Chronic kidney disease, stage 2 (mild): Secondary | ICD-10-CM | POA: Diagnosis not present

## 2020-06-11 DIAGNOSIS — D631 Anemia in chronic kidney disease: Secondary | ICD-10-CM | POA: Diagnosis not present

## 2020-06-11 DIAGNOSIS — N2581 Secondary hyperparathyroidism of renal origin: Secondary | ICD-10-CM | POA: Diagnosis not present

## 2020-06-11 DIAGNOSIS — N189 Chronic kidney disease, unspecified: Secondary | ICD-10-CM | POA: Diagnosis not present

## 2020-09-02 DIAGNOSIS — H16223 Keratoconjunctivitis sicca, not specified as Sjogren's, bilateral: Secondary | ICD-10-CM | POA: Diagnosis not present

## 2020-09-02 DIAGNOSIS — H0102A Squamous blepharitis right eye, upper and lower eyelids: Secondary | ICD-10-CM | POA: Diagnosis not present

## 2020-09-02 DIAGNOSIS — Z961 Presence of intraocular lens: Secondary | ICD-10-CM | POA: Diagnosis not present

## 2020-09-02 DIAGNOSIS — H0102B Squamous blepharitis left eye, upper and lower eyelids: Secondary | ICD-10-CM | POA: Diagnosis not present

## 2020-09-02 DIAGNOSIS — H353132 Nonexudative age-related macular degeneration, bilateral, intermediate dry stage: Secondary | ICD-10-CM | POA: Diagnosis not present

## 2020-09-02 DIAGNOSIS — H401231 Low-tension glaucoma, bilateral, mild stage: Secondary | ICD-10-CM | POA: Diagnosis not present

## 2020-09-02 DIAGNOSIS — H35033 Hypertensive retinopathy, bilateral: Secondary | ICD-10-CM | POA: Diagnosis not present

## 2020-09-02 DIAGNOSIS — H18513 Endothelial corneal dystrophy, bilateral: Secondary | ICD-10-CM | POA: Diagnosis not present

## 2020-09-02 DIAGNOSIS — H25812 Combined forms of age-related cataract, left eye: Secondary | ICD-10-CM | POA: Diagnosis not present

## 2020-10-06 DIAGNOSIS — H2512 Age-related nuclear cataract, left eye: Secondary | ICD-10-CM | POA: Diagnosis not present

## 2020-10-17 DIAGNOSIS — H2512 Age-related nuclear cataract, left eye: Secondary | ICD-10-CM | POA: Diagnosis not present

## 2020-11-26 DIAGNOSIS — C50511 Malignant neoplasm of lower-outer quadrant of right female breast: Secondary | ICD-10-CM | POA: Diagnosis not present

## 2020-11-26 DIAGNOSIS — Z0000 Encounter for general adult medical examination without abnormal findings: Secondary | ICD-10-CM | POA: Diagnosis not present

## 2020-11-26 DIAGNOSIS — M8580 Other specified disorders of bone density and structure, unspecified site: Secondary | ICD-10-CM | POA: Diagnosis not present

## 2020-11-26 DIAGNOSIS — R600 Localized edema: Secondary | ICD-10-CM | POA: Diagnosis not present

## 2020-11-26 DIAGNOSIS — N1831 Chronic kidney disease, stage 3a: Secondary | ICD-10-CM | POA: Diagnosis not present

## 2020-11-26 DIAGNOSIS — Z1159 Encounter for screening for other viral diseases: Secondary | ICD-10-CM | POA: Diagnosis not present

## 2020-11-26 DIAGNOSIS — I129 Hypertensive chronic kidney disease with stage 1 through stage 4 chronic kidney disease, or unspecified chronic kidney disease: Secondary | ICD-10-CM | POA: Diagnosis not present

## 2020-11-26 DIAGNOSIS — R7303 Prediabetes: Secondary | ICD-10-CM | POA: Diagnosis not present

## 2020-11-26 DIAGNOSIS — K219 Gastro-esophageal reflux disease without esophagitis: Secondary | ICD-10-CM | POA: Diagnosis not present

## 2020-11-26 DIAGNOSIS — Z1389 Encounter for screening for other disorder: Secondary | ICD-10-CM | POA: Diagnosis not present

## 2020-12-22 NOTE — Progress Notes (Signed)
Patient Care Team: Donald Prose, MD as PCP - General (Family Medicine) Jovita Kussmaul, MD as Consulting Physician (General Surgery) Nicholas Lose, MD as Consulting Physician (Hematology and Oncology) Thea Silversmith, MD as Consulting Physician (Radiation Oncology) Sylvan Cheese, NP as Nurse Practitioner (Hematology and Oncology)  DIAGNOSIS:    ICD-10-CM   1. Malignant neoplasm of lower-outer quadrant of right breast of female, estrogen receptor positive ([place])  C50.511    Z17.0       SUMMARY OF ONCOLOGIC HISTORY: Oncology History  Breast cancer of lower-outer quadrant of right female breast ([place])  05/03/2015 Mammogram   Right breast tomo: Distortion at 8:30 position: 1.3 x 1.5 x 1.6 cm by ultrasound, additional mass superficial 9 mm size, low density benign-appearing   05/03/2015 Initial Diagnosis   Right breast biopsy 8:30 position: Invasive ductal carcinoma with perineural invasion, grade 1, ER 100%, PR 40%, Ki-67 20%, HER-2 negative ratio 1.26   05/03/2015 Clinical Stage   Stage IA: T1c N0   06/17/2015 Surgery   Rt Lumpectomy: IDC with DCIS 1.5 cm , 0/3 LN Neg, grade 2, ER 100%, PR 40%, Ki-67 20%, HER-2 negative ratio 1.26, margins neg   06/17/2015 Pathologic Stage   Stage IA: T1c N0   06/17/2015 Oncotype testing   RS 19 (12% ROR)   07/31/2015 - 08/21/2015 Radiation Therapy   Adjuvant radiation therapy: Right breast/ 42.72 Gy at 2.67 Gy per fraction x 21 fractions.    09/06/2015 -  Anti-estrogen oral therapy   Tamoxifen 10 mg daily.  Planned duration of therapy 5 years.   10/03/2015 Survivorship   SCP visit completed     CHIEF COMPLIANT: Follow-up of right breast cancer on tamoxifen therapy  INTERVAL HISTORY: Karen Roth is a 70 y.o. with above-mentioned history of right breast cancer treated with lumpectomy, radiation, and who is currently on tamoxifen therapy. Mammogram on 05/17/2020 showed no evidence of malignancy bilaterally. She presents to the  clinic today for annual follow-up.  She takes tamoxifen 5 mg every other day and appears to be tolerating that reasonably well.  ALLERGIES:  is allergic to doxycycline, lisinopril, and shellfish allergy.  MEDICATIONS:  Current Outpatient Medications  Medication Sig Dispense Refill   acetaminophen (TYLENOL) 500 MG tablet Take 500-1,000 mg by mouth daily as needed for mild pain or headache. Reported on 08/20/2015     ALPRAZolam (XANAX) 0.5 MG tablet Take 0.5 mg by mouth 3 (three) times daily as needed for anxiety.      amLODipine (NORVASC) 5 MG tablet Take 5 mg by mouth daily.      APPLE CIDER VINEGAR PO Take 15 mLs by mouth daily.     aspirin EC 81 MG tablet Take 81 mg by mouth daily.      Bacillus Coagulans-Inulin (ALIGN PREBIOTIC-PROBIOTIC) 5-1.25 MG-GM CHEW Chew 1 tablet by mouth daily.     Calcium Citrate-Vitamin D (CITRACAL + D PO) Take 1 tablet by mouth daily.      Cholecalciferol (VITAMIN D3) 2000 units capsule Take 2,000 Units by mouth daily.      cromolyn (OPTICROM) 4 % ophthalmic solution INT 1 GTT INTO OU TWO TO QID  3   Multiple Vitamin (MULTI-VITAMINS) TABS Take by mouth.     Multiple Vitamins-Minerals (PRESERVISION AREDS 2) CAPS Take 1 capsule by mouth 2 (two) times daily.     Omega-3 1000 MG CAPS Take by mouth.     omeprazole (PRILOSEC) 20 MG capsule Take 20 mg by mouth daily as needed (for  heartburn or acid reflux). Reported on 07/18/2015     protein supplement shake (PREMIER PROTEIN) LIQD Take 2 oz by mouth daily as needed (for bone support).     tamoxifen (NOLVADEX) 20 MG tablet TAKE 1 TABLET BY MOUTH DAILY 90 tablet 0   timolol (BETIMOL) 0.25 % ophthalmic solution Place 1-2 drops into the left eye 2 (two) times daily. 10 mL 12   Turmeric (QC TUMERIC COMPLEX) 500 MG CAPS Take 2,000 mg by mouth daily.     No current facility-administered medications for this visit.    PHYSICAL EXAMINATION: ECOG PERFORMANCE STATUS: 1 - Symptomatic but completely ambulatory  Vitals:    12/24/20 0929  BP: (!) 156/66  Pulse: 72  Resp: 18  Temp: (!) 97.5 F (36.4 C)  SpO2: 100%   Filed Weights   12/24/20 0929  Weight: 177 lb 12.8 oz (80.6 kg)    BREAST: No palpable masses or nodules in either right or left breasts. No palpable axillary supraclavicular or infraclavicular adenopathy no breast tenderness or nipple discharge. (exam performed in the presence of a chaperone)  LABORATORY DATA:  I have reviewed the data as listed CMP Latest Ref Rng & Units 06/10/2015 05/15/2015 01/06/2011  Glucose 65 - 99 mg/dL 98 68(L) 90  BUN 6 - 20 mg/dL 18 17.7 10  Creatinine 0.44 - 1.00 mg/dL 1.06(H) 1.3(H) 0.80  Sodium 135 - 145 mmol/L 139 141 143  Potassium 3.5 - 5.1 mmol/L 3.6 4.3 3.5  Chloride 101 - 111 mmol/L 103 - 107  CO2 22 - 32 mmol/L 26 28 31   Calcium 8.9 - 10.3 mg/dL 10.0 10.0 9.4  Total Protein 6.4 - 8.3 g/dL - 7.7 6.6  Total Bilirubin 0.20 - 1.20 mg/dL - <0.30 0.3  Alkaline Phos 40 - 150 U/L - 60 41  AST 5 - 34 U/L - 19 17  ALT 0 - 55 U/L - 18 15    Lab Results  Component Value Date   WBC 9.0 06/10/2015   HGB 15.0 06/10/2015   HCT 45.7 06/10/2015   MCV 84.8 06/10/2015   PLT 248 06/10/2015   NEUTROABS 9.5 (H) 05/15/2015    ASSESSMENT & PLAN:  Breast cancer of lower-outer quadrant of right female breast (Karen Roth) Rt Lumpectomy 06/17/15: IDC with DCIS 1.5 cm , 0/3 LN Neg, T1cN0 (Stage 1 A) grade 1, ER 100%, PR 40%, Ki-67 20%, HER-2 negative ratio 1.26, margins Neg, Oncotype DX score 19, 12% ROR Adjuvant radiation therapy from 07/31/2015 to 08/21/2015   Current Treatment: Adjuvant antiestrogen therapy with Tamoxifen 5 mg daily started 09/06/2015 (due to osteopenia and inability to tolerate bisphosphonates in the past)   Tamoxifen toxicities: 1. Severe hot flashes: Improved 2.  musculoskeletal aches and pains : Much improved with turmeric as well as taking 5 mg daily.   She is currently taking tamoxifen 5 mg every other day.  We will perform breast cancer index and  determine if she would benefit from extended adjuvant therapy   Breast cancer surveillance: 1.  Breast exam: 12/24/2020 benign  2. mammogram 05/17/2020: Benign breast density category C 3. bone density test which showed a T score of -2 osteopenia which has not changed for the past 6 years    She exercises at the [facility] and stays very active.   Return to clinic in 1 year for follow-up and surveillance exams    No orders of the defined types were placed in this encounter.  The patient has a good understanding of the overall plan.  she agrees with it. she will call with any problems that may develop before the next visit here.  Total time spent: 20 mins including face to face time and time spent for planning, charting and coordination of care  Rulon Eisenmenger, MD, MPH 12/24/2020  I, Thana Ates, am acting as scribe for Dr. Nicholas Lose.  I have reviewed the above documentation for accuracy and completeness, and I agree with the above.

## 2020-12-24 ENCOUNTER — Telehealth: Payer: Self-pay | Admitting: *Deleted

## 2020-12-24 ENCOUNTER — Inpatient Hospital Stay: Payer: Medicare PPO | Attending: Hematology and Oncology | Admitting: Hematology and Oncology

## 2020-12-24 ENCOUNTER — Other Ambulatory Visit: Payer: Self-pay

## 2020-12-24 DIAGNOSIS — Z170 Estrogen receptor positive status [ER+]: Secondary | ICD-10-CM | POA: Insufficient documentation

## 2020-12-24 DIAGNOSIS — M8580 Other specified disorders of bone density and structure, unspecified site: Secondary | ICD-10-CM | POA: Insufficient documentation

## 2020-12-24 DIAGNOSIS — Z79899 Other long term (current) drug therapy: Secondary | ICD-10-CM | POA: Insufficient documentation

## 2020-12-24 DIAGNOSIS — Z79810 Long term (current) use of selective estrogen receptor modulators (SERMs): Secondary | ICD-10-CM | POA: Diagnosis not present

## 2020-12-24 DIAGNOSIS — C50511 Malignant neoplasm of lower-outer quadrant of right female breast: Secondary | ICD-10-CM | POA: Insufficient documentation

## 2020-12-24 NOTE — Telephone Encounter (Signed)
Ordered BCI per Dr. Lindi Adie. Faxed requisition to pathology and [facility].

## 2020-12-24 NOTE — Assessment & Plan Note (Signed)
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 Severe hot flashes: Improved 2. musculoskeletal aches and pains :Much improved with turmeric as well as taking 5 mg daily.  Patient has 1 more year of tamoxifen left and after that she will discontinue it.  Rotator cuff tendinitis: Resolved  Breast cancer surveillance: 1.  Breast exam: 12/24/2020 benign  2. mammogram 05/17/2020: Benign breast density category C 3. bone density test which showed a T score of -2 osteopenia which has not changed for the past 6 years   Recent falls with twisting of the right ankle: Patient has a brace.  Return to clinic in1 yearfor follow-up and surveillance exams

## 2021-01-06 ENCOUNTER — Encounter (INDEPENDENT_AMBULATORY_CARE_PROVIDER_SITE_OTHER): Payer: Medicare PPO | Admitting: Ophthalmology

## 2021-01-06 ENCOUNTER — Other Ambulatory Visit: Payer: Self-pay

## 2021-01-06 DIAGNOSIS — H43813 Vitreous degeneration, bilateral: Secondary | ICD-10-CM | POA: Diagnosis not present

## 2021-01-06 DIAGNOSIS — H2703 Aphakia, bilateral: Secondary | ICD-10-CM

## 2021-01-06 DIAGNOSIS — H35033 Hypertensive retinopathy, bilateral: Secondary | ICD-10-CM

## 2021-01-06 DIAGNOSIS — H353132 Nonexudative age-related macular degeneration, bilateral, intermediate dry stage: Secondary | ICD-10-CM | POA: Diagnosis not present

## 2021-01-06 DIAGNOSIS — I10 Essential (primary) hypertension: Secondary | ICD-10-CM

## 2021-01-13 ENCOUNTER — Encounter: Payer: Self-pay | Admitting: Hematology and Oncology

## 2021-01-15 ENCOUNTER — Encounter (HOSPITAL_COMMUNITY): Payer: Self-pay

## 2021-02-07 ENCOUNTER — Other Ambulatory Visit: Payer: Self-pay | Admitting: Physician Assistant

## 2021-02-07 DIAGNOSIS — R1032 Left lower quadrant pain: Secondary | ICD-10-CM

## 2021-02-07 DIAGNOSIS — R350 Frequency of micturition: Secondary | ICD-10-CM | POA: Diagnosis not present

## 2021-02-19 ENCOUNTER — Ambulatory Visit
Admission: RE | Admit: 2021-02-19 | Discharge: 2021-02-19 | Disposition: A | Discharge status: Home / Self Care | Payer: Medicare PPO | Source: Ambulatory Visit | Attending: Physician Assistant | Admitting: Physician Assistant

## 2021-02-19 DIAGNOSIS — Q8909 Congenital malformations of spleen: Secondary | ICD-10-CM | POA: Diagnosis not present

## 2021-02-19 DIAGNOSIS — R1032 Left lower quadrant pain: Secondary | ICD-10-CM

## 2021-02-19 DIAGNOSIS — K5730 Diverticulosis of large intestine without perforation or abscess without bleeding: Secondary | ICD-10-CM | POA: Diagnosis not present

## 2021-02-19 DIAGNOSIS — N3289 Other specified disorders of bladder: Secondary | ICD-10-CM | POA: Diagnosis not present

## 2021-02-19 DIAGNOSIS — K449 Diaphragmatic hernia without obstruction or gangrene: Secondary | ICD-10-CM | POA: Diagnosis not present

## 2021-02-19 MED ORDER — IOPAMIDOL (ISOVUE-370) INJECTION 76%
100.0000 mL | Freq: Once | INTRAVENOUS | Status: AC | PRN
  Administered 2021-02-19: 100 mL via INTRAVENOUS

## 2021-02-20 ENCOUNTER — Telehealth: Payer: Self-pay

## 2021-02-20 NOTE — Telephone Encounter (Signed)
Per Wilber Bihari, notified pt that CT scans were fine.  Pt verbalized thanks

## 2021-03-17 DIAGNOSIS — H16223 Keratoconjunctivitis sicca, not specified as Sjogren's, bilateral: Secondary | ICD-10-CM | POA: Diagnosis not present

## 2021-03-17 DIAGNOSIS — Z961 Presence of intraocular lens: Secondary | ICD-10-CM | POA: Diagnosis not present

## 2021-03-17 DIAGNOSIS — H0102B Squamous blepharitis left eye, upper and lower eyelids: Secondary | ICD-10-CM | POA: Diagnosis not present

## 2021-03-17 DIAGNOSIS — H18513 Endothelial corneal dystrophy, bilateral: Secondary | ICD-10-CM | POA: Diagnosis not present

## 2021-03-17 DIAGNOSIS — H0102A Squamous blepharitis right eye, upper and lower eyelids: Secondary | ICD-10-CM | POA: Diagnosis not present

## 2021-03-17 DIAGNOSIS — H401231 Low-tension glaucoma, bilateral, mild stage: Secondary | ICD-10-CM | POA: Diagnosis not present

## 2021-04-04 ENCOUNTER — Telehealth: Payer: Self-pay | Admitting: Hematology and Oncology

## 2021-04-04 NOTE — Telephone Encounter (Signed)
I discussed results of breast cancer index which showed that there was no benefit for extended endocrine therapy and therefore I instructed her to stop her antiestrogen treatment at this time.  Patient was very happy to hear that.

## 2021-04-22 DIAGNOSIS — Z170 Estrogen receptor positive status [ER+]: Secondary | ICD-10-CM | POA: Diagnosis not present

## 2021-04-22 DIAGNOSIS — C50511 Malignant neoplasm of lower-outer quadrant of right female breast: Secondary | ICD-10-CM | POA: Diagnosis not present

## 2021-05-28 DIAGNOSIS — Z6830 Body mass index (BMI) 30.0-30.9, adult: Secondary | ICD-10-CM | POA: Diagnosis not present

## 2021-05-28 DIAGNOSIS — Z779 Other contact with and (suspected) exposures hazardous to health: Secondary | ICD-10-CM | POA: Diagnosis not present

## 2021-05-28 DIAGNOSIS — Z124 Encounter for screening for malignant neoplasm of cervix: Secondary | ICD-10-CM | POA: Diagnosis not present

## 2021-05-28 DIAGNOSIS — Z1151 Encounter for screening for human papillomavirus (HPV): Secondary | ICD-10-CM | POA: Diagnosis not present

## 2021-05-28 DIAGNOSIS — Z1231 Encounter for screening mammogram for malignant neoplasm of breast: Secondary | ICD-10-CM | POA: Diagnosis not present

## 2021-05-28 DIAGNOSIS — Z853 Personal history of malignant neoplasm of breast: Secondary | ICD-10-CM | POA: Diagnosis not present

## 2021-07-07 DIAGNOSIS — N958 Other specified menopausal and perimenopausal disorders: Secondary | ICD-10-CM | POA: Diagnosis not present

## 2021-07-07 DIAGNOSIS — M4126 Other idiopathic scoliosis, lumbar region: Secondary | ICD-10-CM | POA: Diagnosis not present

## 2021-07-07 DIAGNOSIS — M8588 Other specified disorders of bone density and structure, other site: Secondary | ICD-10-CM | POA: Diagnosis not present

## 2021-07-07 DIAGNOSIS — R29890 Loss of height: Secondary | ICD-10-CM | POA: Diagnosis not present

## 2021-07-10 ENCOUNTER — Observation Stay (HOSPITAL_COMMUNITY): Payer: Medicare PPO

## 2021-07-10 ENCOUNTER — Other Ambulatory Visit: Payer: Self-pay

## 2021-07-10 ENCOUNTER — Observation Stay (HOSPITAL_COMMUNITY)
Admission: EM | Admit: 2021-07-10 | Discharge: 2021-07-11 | Disposition: A | Discharge status: Home / Self Care | Payer: Medicare PPO | Attending: Internal Medicine | Admitting: Internal Medicine

## 2021-07-10 ENCOUNTER — Encounter (HOSPITAL_COMMUNITY): Payer: Self-pay | Admitting: Emergency Medicine

## 2021-07-10 DIAGNOSIS — Z79899 Other long term (current) drug therapy: Secondary | ICD-10-CM | POA: Diagnosis not present

## 2021-07-10 DIAGNOSIS — K922 Gastrointestinal hemorrhage, unspecified: Secondary | ICD-10-CM | POA: Diagnosis present

## 2021-07-10 DIAGNOSIS — K625 Hemorrhage of anus and rectum: Secondary | ICD-10-CM | POA: Diagnosis not present

## 2021-07-10 DIAGNOSIS — Z20822 Contact with and (suspected) exposure to covid-19: Secondary | ICD-10-CM | POA: Insufficient documentation

## 2021-07-10 DIAGNOSIS — C50511 Malignant neoplasm of lower-outer quadrant of right female breast: Secondary | ICD-10-CM | POA: Diagnosis not present

## 2021-07-10 DIAGNOSIS — K219 Gastro-esophageal reflux disease without esophagitis: Secondary | ICD-10-CM | POA: Diagnosis not present

## 2021-07-10 DIAGNOSIS — Z7982 Long term (current) use of aspirin: Secondary | ICD-10-CM | POA: Diagnosis not present

## 2021-07-10 DIAGNOSIS — D3500 Benign neoplasm of unspecified adrenal gland: Secondary | ICD-10-CM | POA: Diagnosis not present

## 2021-07-10 DIAGNOSIS — K5730 Diverticulosis of large intestine without perforation or abscess without bleeding: Secondary | ICD-10-CM | POA: Diagnosis not present

## 2021-07-10 DIAGNOSIS — F17210 Nicotine dependence, cigarettes, uncomplicated: Secondary | ICD-10-CM | POA: Diagnosis not present

## 2021-07-10 DIAGNOSIS — I10 Essential (primary) hypertension: Secondary | ICD-10-CM | POA: Diagnosis not present

## 2021-07-10 HISTORY — DX: Diverticulosis of intestine, part unspecified, without perforation or abscess without bleeding: K57.90

## 2021-07-10 LAB — URINALYSIS, ROUTINE W REFLEX MICROSCOPIC
Bacteria, UA: NONE SEEN
Bilirubin Urine: NEGATIVE
Glucose, UA: NEGATIVE mg/dL
Hgb urine dipstick: NEGATIVE
Ketones, ur: 5 mg/dL — AB
Nitrite: NEGATIVE
Protein, ur: NEGATIVE mg/dL
Specific Gravity, Urine: 1.019 (ref 1.005–1.030)
pH: 5 (ref 5.0–8.0)

## 2021-07-10 LAB — CBC WITH DIFFERENTIAL/PLATELET
Abs Immature Granulocytes: 0.03 10*3/uL (ref 0.00–0.07)
Basophils Absolute: 0.1 10*3/uL (ref 0.0–0.1)
Basophils Relative: 1 %
Eosinophils Absolute: 0.2 10*3/uL (ref 0.0–0.5)
Eosinophils Relative: 2 %
HCT: 41.4 % (ref 36.0–46.0)
Hemoglobin: 13.7 g/dL (ref 12.0–15.0)
Immature Granulocytes: 0 %
Lymphocytes Relative: 29 %
Lymphs Abs: 3.4 10*3/uL (ref 0.7–4.0)
MCH: 28.2 pg (ref 26.0–34.0)
MCHC: 33.1 g/dL (ref 30.0–36.0)
MCV: 85.2 fL (ref 80.0–100.0)
Monocytes Absolute: 0.8 10*3/uL (ref 0.1–1.0)
Monocytes Relative: 7 %
Neutro Abs: 7.1 10*3/uL (ref 1.7–7.7)
Neutrophils Relative %: 61 %
Platelets: 262 10*3/uL (ref 150–400)
RBC: 4.86 MIL/uL (ref 3.87–5.11)
RDW: 14.2 % (ref 11.5–15.5)
WBC: 11.6 10*3/uL — ABNORMAL HIGH (ref 4.0–10.5)
nRBC: 0 % (ref 0.0–0.2)

## 2021-07-10 LAB — CBC
HCT: 39.3 % (ref 36.0–46.0)
HCT: 38.3 % (ref 36.0–46.0)
HCT: 35.3 % — ABNORMAL LOW (ref 36.0–46.0)
Hemoglobin: 12.7 g/dL (ref 12.0–15.0)
Hemoglobin: 12.2 g/dL (ref 12.0–15.0)
Hemoglobin: 11.5 g/dL — ABNORMAL LOW (ref 12.0–15.0)
MCH: 28.1 pg (ref 26.0–34.0)
MCH: 27.7 pg (ref 26.0–34.0)
MCH: 28 pg (ref 26.0–34.0)
MCHC: 32.3 g/dL (ref 30.0–36.0)
MCHC: 31.9 g/dL (ref 30.0–36.0)
MCHC: 32.6 g/dL (ref 30.0–36.0)
MCV: 86.9 fL (ref 80.0–100.0)
MCV: 87 fL (ref 80.0–100.0)
MCV: 85.9 fL (ref 80.0–100.0)
Platelets: 219 10*3/uL (ref 150–400)
Platelets: 227 10*3/uL (ref 150–400)
Platelets: 225 10*3/uL (ref 150–400)
RBC: 4.52 MIL/uL (ref 3.87–5.11)
RBC: 4.4 MIL/uL (ref 3.87–5.11)
RBC: 4.11 MIL/uL (ref 3.87–5.11)
RDW: 14.4 % (ref 11.5–15.5)
RDW: 14.6 % (ref 11.5–15.5)
RDW: 14.6 % (ref 11.5–15.5)
WBC: 12.6 10*3/uL — ABNORMAL HIGH (ref 4.0–10.5)
WBC: 8.6 10*3/uL (ref 4.0–10.5)
WBC: 8.1 10*3/uL (ref 4.0–10.5)
nRBC: 0 % (ref 0.0–0.2)
nRBC: 0 % (ref 0.0–0.2)
nRBC: 0 % (ref 0.0–0.2)

## 2021-07-10 LAB — TYPE AND SCREEN
ABO/RH(D): B POS
Antibody Screen: NEGATIVE

## 2021-07-10 LAB — COMPREHENSIVE METABOLIC PANEL
ALT: 32 U/L (ref 0–44)
AST: 55 U/L — ABNORMAL HIGH (ref 15–41)
Albumin: 3.8 g/dL (ref 3.5–5.0)
Alkaline Phosphatase: 99 U/L (ref 38–126)
Anion gap: 7 (ref 5–15)
BUN: 26 mg/dL — ABNORMAL HIGH (ref 8–23)
CO2: 23 mmol/L (ref 22–32)
Calcium: 9.1 mg/dL (ref 8.9–10.3)
Chloride: 105 mmol/L (ref 98–111)
Creatinine, Ser: 1.38 mg/dL — ABNORMAL HIGH (ref 0.44–1.00)
GFR, Estimated: 41 mL/min — ABNORMAL LOW (ref >60)
Glucose, Bld: 130 mg/dL — ABNORMAL HIGH (ref 70–99)
Potassium: 4.3 mmol/L (ref 3.5–5.1)
Sodium: 135 mmol/L (ref 135–145)
Total Bilirubin: 1 mg/dL (ref 0.3–1.2)
Total Protein: 7.1 g/dL (ref 6.5–8.1)

## 2021-07-10 LAB — RESP PANEL BY RT-PCR (FLU A&B, COVID) ARPGX2
Influenza A by PCR: NEGATIVE
Influenza B by PCR: NEGATIVE
SARS Coronavirus 2 by RT PCR: NEGATIVE

## 2021-07-10 LAB — HIV ANTIBODY (ROUTINE TESTING W REFLEX): HIV Screen 4th Generation wRfx: NONREACTIVE

## 2021-07-10 MED ORDER — SODIUM CHLORIDE (PF) 0.9 % IJ SOLN
INTRAMUSCULAR | Status: AC
  Filled 2021-07-10: qty 50

## 2021-07-10 MED ORDER — IOHEXOL 300 MG/ML  SOLN
80.0000 mL | Freq: Once | INTRAMUSCULAR | Status: AC | PRN
  Administered 2021-07-10: 80 mL via INTRAVENOUS

## 2021-07-10 MED ORDER — SODIUM CHLORIDE 0.9 % IV BOLUS
1000.0000 mL | Freq: Once | INTRAVENOUS | Status: AC
  Administered 2021-07-10: 1000 mL via INTRAVENOUS

## 2021-07-10 MED ORDER — HYDRALAZINE HCL 20 MG/ML IJ SOLN: 10.0000 mg | INTRAMUSCULAR | Status: DC | PRN

## 2021-07-10 MED ORDER — LACTATED RINGERS IV SOLN: INTRAVENOUS | Status: AC

## 2021-07-10 NOTE — ED Triage Notes (Addendum)
Patient presents to ED from home w c/o bloody diarrhea that began around 8pm. Pt reports 2 episodes of bloody stools, states underwear were full of blood earlier. Endorses lower abdominal cramping, denies N/V. Hx diverticulitis, HTN, restricted right extremity  ?

## 2021-07-10 NOTE — ED Notes (Signed)
Pt given lunch tray.

## 2021-07-10 NOTE — H&P (Signed)
?History and Physical  ? ? ?Karen Roth XBJ:[id number] DOB: 1950/07/26 DOA: 07/10/2021 ? ?PCP: Donald Prose, MD  ?Patient coming from: Home. ? ?Chief Complaint: Rectal bleeding. ? ?HPI: Karen Roth is a 71 y.o. female with history of breast cancer in remission, hypertension presents to the ER with complaints of rectal bleeding.  Patient started rectal bleeding last evening at home patient moved bowels and found to be having frank rectal bleeding.  Following which patient had some left lower quadrant abdominal discomfort.  Denies any fever chills.  Denies taking any NSAIDs or aspirin.  Had 2 bowel movements both were bloody.  Patient states she did have a colonoscopy by Dr. Michail Sermon in 2019. ? ?ED Course: In the ER patient was hemodynamically stable.  Mild leukocytosis hemoglobin was 13.7.  COVID test was negative.  On exam patient has left lower quadrant tenderness CT scan is pending. ? ?Review of Systems: As per HPI, rest all negative. ? ? ?Past Medical History:  ?Diagnosis Date  ? Anxiety   ? takes Xanax daily as needed  ? Arthritis   ? Breast cancer ([place])   ? Diverticulosis   ? GERD (gastroesophageal reflux disease)   ? takes Omeprazole daily as needed  ? Headache   ? more so since diagnosis of breast cancer  ? History of bronchitis > 20 yrs ago  ? History of colon polyps   ? benign  ? History of renal failure 2013  ? saw a kidney specialist and was released a yr ago.Thought it was back related  ? Hypertension   ? takes Amlodipine daily  ? Neck pain   ? and right shoulder.Arthritis and Bone Spurs  ? Osteoarthritis   ? Personal history of radiation therapy   ? 2017  ? Urinary frequency   ? ? ?Past Surgical History:  ?Procedure Laterality Date  ? ABDOMINAL HYSTERECTOMY    ? APPENDECTOMY    ? BACK SURGERY  1971  ? [place] Rod Placement  ? BACK SURGERY    ? BREAST LUMPECTOMY Right 06/17/2015  ? Malignant  ? BREAST LUMPECTOMY WITH RADIOACTIVE SEED AND SENTINEL LYMPH NODE BIOPSY Right 06/17/2015  ?  Procedure: BREAST LUMPECTOMY WITH RADIOACTIVE SEED AND SENTINEL LYMPH NODE BIOPSY;  Surgeon: Autumn Messing III, MD;  Location: [place];  Service: General;  Laterality: Right;  ? colonoscopy    ? TONSILLECTOMY    ? ? ? reports that she has been smoking cigarettes. She has never used smokeless tobacco. She reports current alcohol use. She reports that she does not use drugs. ? ?Allergies  ?Allergen Reactions  ? Doxycycline Other (See Comments)  ?  Stomach burning and pain  ? Lisinopril Other (See Comments)  ?  Kidney failure  ? Shellfish Allergy Hives  ? ? ?Family History  ?Problem Relation Age of Onset  ? Cancer Maternal Aunt   ?     GYN  ? Breast cancer Cousin   ? ? ?Prior to Admission medications   ?Medication Sig Start Date End Date Taking? Authorizing Provider  ?acetaminophen (TYLENOL) 500 MG tablet Take 500-1,000 mg by mouth daily as needed for mild pain or headache. Reported on 08/20/2015    [provider]  ?ALPRAZolam Duanne Moron) 0.5 MG tablet Take 0.5 mg by mouth 3 (three) times daily as needed for anxiety.     [provider]  ?amLODipine (NORVASC) 5 MG tablet Take 5 mg by mouth daily.     [provider]  ?APPLE CIDER VINEGAR PO Take  15 mLs by mouth daily.    [provider]  ?aspirin EC 81 MG tablet Take 81 mg by mouth daily.     [provider]  ?Bacillus Coagulans-Inulin (ALIGN PREBIOTIC-PROBIOTIC) 5-1.25 MG-GM CHEW Chew 1 tablet by mouth daily. 12/20/18   Nicholas Lose, MD  ?Calcium Citrate-Vitamin D (CITRACAL + D PO) Take 1 tablet by mouth daily.     [provider]  ?Cholecalciferol (VITAMIN D3) 2000 units capsule Take 2,000 Units by mouth daily.     [provider]  ?cromolyn (OPTICROM) 4 % ophthalmic solution INT 1 GTT INTO OU TWO TO QID 03/18/15   [provider]  ?Multiple Vitamin (MULTI-VITAMINS) TABS Take by mouth.    [provider]  ?Multiple Vitamins-Minerals (PRESERVISION AREDS 2) CAPS Take 1 capsule by mouth 2 (two) times  daily. 12/21/17   Nicholas Lose, MD  ?Omega-3 1000 MG CAPS Take by mouth.    [provider]  ?omeprazole (PRILOSEC) 20 MG capsule Take 20 mg by mouth daily as needed (for heartburn or acid reflux). Reported on 07/18/2015    [provider]  ?protein supplement shake (PREMIER PROTEIN) LIQD Take 2 oz by mouth daily as needed (for bone support).    [provider]  ?tamoxifen (NOLVADEX) 20 MG tablet TAKE 1 TABLET BY MOUTH DAILY 02/27/20   Nicholas Lose, MD  ?timolol (BETIMOL) 0.25 % ophthalmic solution Place 1-2 drops into the left eye 2 (two) times daily. 12/15/16   Nicholas Lose, MD  ?Turmeric (QC TUMERIC COMPLEX) 500 MG CAPS Take 2,000 mg by mouth daily. 12/20/18   Nicholas Lose, MD  ? ? ?Physical Exam: ?Constitutional: Moderately built and nourished. ?Vitals:  ? 07/10/21 0039 07/10/21 0121 07/10/21 0130 07/10/21 0132  ?BP:  (!) 141/80    ?Pulse: (!) 110 91 85 83  ?Resp: '20 11 14 11  '$ ?Temp: 98 ?F (36.7 ?C)     ?TempSrc: Oral     ?SpO2: 96% 100% 99% 99%  ?Weight:      ?Height:      ? ?Eyes: Anicteric no pallor. ?ENMT: No discharge from the ears eyes nose and mouth. ?Neck: No mass felt.  No neck rigidity. ?Respiratory: No rhonchi or crepitations. ?Cardiovascular: S1-S2 heard. ?Abdomen: Soft mild tenderness in left lower quadrant no guarding or rigidity. ?Musculoskeletal: No edema. ?Skin: No rash. ?Neurologic: Alert awake oriented time place and person.  Moves all extremities. ?Psychiatric: Appears normal.  Normal affect. ? ? ?Labs on Admission: I have personally reviewed following labs and imaging studies ? ?CBC: ?Recent Labs  ?Lab 07/10/21 ?1950  ?WBC 11.6*  ?NEUTROABS 7.1  ?HGB 13.7  ?HCT 41.4  ?MCV 85.2  ?PLT 262  ? ?Basic Metabolic Panel: ?Recent Labs  ?Lab 07/10/21 ?9326  ?NA 135  ?K 4.3  ?CL 105  ?CO2 23  ?GLUCOSE 130*  ?BUN 26*  ?CREATININE 1.38*  ?CALCIUM 9.1  ? ?GFR: ?Estimated Creatinine Clearance: 40.5 mL/min (A) (by C-G formula based on SCr of 1.38 mg/dL (H)). ?Liver Function  Tests: ?Recent Labs  ?Lab 07/10/21 ?7124  ?AST 55*  ?ALT 32  ?ALKPHOS 99  ?BILITOT 1.0  ?PROT 7.1  ?ALBUMIN 3.8  ? ?No results for input(s): LIPASE, AMYLASE in the last 168 hours. ?No results for input(s): AMMONIA in the last 168 hours. ?Coagulation Profile: ?No results for input(s): INR, PROTIME in the last 168 hours. ?Cardiac Enzymes: ?No results for input(s): CKTOTAL, CKMB, CKMBINDEX, TROPONINI in the last 168 hours. ?BNP (last 3 results) ?No results for input(s):  PROBNP in the last 8760 hours. ?HbA1C: ?No results for input(s): HGBA1C in the last 72 hours. ?CBG: ?No results for input(s): GLUCAP in the last 168 hours. ?Lipid Profile: ?No results for input(s): CHOL, HDL, LDLCALC, TRIG, CHOLHDL, LDLDIRECT in the last 72 hours. ?Thyroid Function Tests: ?No results for input(s): TSH, T4TOTAL, FREET4, T3FREE, THYROIDAB in the last 72 hours. ?Anemia Panel: ?No results for input(s): VITAMINB12, FOLATE, FERRITIN, TIBC, IRON, RETICCTPCT in the last 72 hours. ?Urine analysis: ?   ?Component Value Date/Time  ? [place] YELLOW 07/10/2021 0000  ? APPEARANCEUR CLEAR 07/10/2021 0000  ? LABSPEC 1.019 07/10/2021 0000  ? PHURINE 5.0 07/10/2021 0000  ? GLUCOSEU NEGATIVE 07/10/2021 0000  ? HGBUR NEGATIVE 07/10/2021 0000  ? [place] NEGATIVE 07/10/2021 0000  ? KETONESUR 5 (A) 07/10/2021 0000  ? PROTEINUR NEGATIVE 07/10/2021 0000  ? NITRITE NEGATIVE 07/10/2021 0000  ? LEUKOCYTESUR MODERATE (A) 07/10/2021 0000  ? ?Sepsis Labs: ?'@LABRCNTIP'$ (procalcitonin:4,lacticidven:4) ?)No results found for this or any previous visit (from the past 240 hour(s)).  ? ?Radiological Exams on Admission: ?No results found. ? ? ? ?Assessment/Plan ?Principal Problem: ?  Rectal bleeding ?Active Problems: ?  Breast cancer of lower-outer quadrant of right female breast ([place]) ?  Essential hypertension ?  ? ?Rectal bleeding likely lower GI.  Will check serial CBC keep patient on clear liquid diet.  Since patient has left lower quadrant tenderness we will check  CT scan abdomen pelvis.  We will consult [facility]. ?History of hypertension on amlodipine.  We will hold amlodipine for now and keep patient on as needed IV hydralazine. ?History of breast cancer in remission.

## 2021-07-10 NOTE — ED Notes (Signed)
Walked patient to bathroom. Patient requested something to drink. Explained to patient she is NPO>  ?

## 2021-07-10 NOTE — ED Provider Notes (Signed)
? ?[facility] DEPT ?Provider Note: [name], MD, [place] ? ?CSN: [id number] ?MRN: [id number] ?ARRIVAL: 07/10/21 at [place] ?ROOM: WA16/WA16 ? ? ?CHIEF COMPLAINT  ?Rectal Bleeding ? ? ?HISTORY OF PRESENT ILLNESS  ?07/10/21 12:43 AM ?Karen Roth is a 71 y.o. female with "explosive" bloody diarrhea that began about 8 PM yesterday evening.  She has had 2 episodes of bloody stools but also states she had blood in her underwear.  She had a normal bowel movement yesterday morning which is her norm.  She is having LLQ abdominal cramping which she rates as a 3 out of 10.  She denies associated nausea and vomiting.  She has a history of diverticulitis.  She was noted to be tachycardic on arrival.  ? ? ?Past Medical History:  ?Diagnosis Date  ? Anxiety   ? takes Xanax daily as needed  ? Arthritis   ? Breast cancer ([place])   ? GERD (gastroesophageal reflux disease)   ? takes Omeprazole daily as needed  ? Headache   ? more so since diagnosis of breast cancer  ? History of bronchitis > 20 yrs ago  ? History of colon polyps   ? benign  ? History of renal failure 2013  ? saw a kidney specialist and was released a yr ago.Thought it was back related  ? Hypertension   ? takes Amlodipine daily  ? Neck pain   ? and right shoulder.Arthritis and Bone Spurs  ? Osteoarthritis   ? Personal history of radiation therapy   ? 2017  ? Urinary frequency   ? ? ?Past Surgical History:  ?Procedure Laterality Date  ? ABDOMINAL HYSTERECTOMY    ? APPENDECTOMY    ? BACK SURGERY  1971  ? Harrington Rod Placement  ? BACK SURGERY    ? BREAST LUMPECTOMY Right 06/17/2015  ? Malignant  ? BREAST LUMPECTOMY WITH RADIOACTIVE SEED AND SENTINEL LYMPH NODE BIOPSY Right 06/17/2015  ? Procedure: BREAST LUMPECTOMY WITH RADIOACTIVE SEED AND SENTINEL LYMPH NODE BIOPSY;  Surgeon: Autumn Messing III, MD;  Location: [facility];  Service: General;  Laterality: Right;  ? colonoscopy    ? TONSILLECTOMY    ? ? ?Family History  ?Problem Relation Age of Onset  ? Cancer Maternal  Aunt   ?     GYN  ? Breast cancer Cousin   ? ? ?Social History  ? ?Tobacco Use  ? Smoking status: Every Day  ?  Packs/day: 0.00  ?  Years: 30.00  ?  Pack years: 0.00  ?  Types: Cigarettes  ? Smokeless tobacco: Never  ? Tobacco comments:  ?  maybe 3 a day  ?Substance Use Topics  ? Alcohol use: Yes  ?  Comment: wine  nighlty  ? Drug use: No  ? ? ?Prior to Admission medications   ?Medication Sig Start Date End Date Taking? Authorizing Provider  ?acetaminophen (TYLENOL) 500 MG tablet Take 500-1,000 mg by mouth daily as needed for mild pain or headache. Reported on 08/20/2015    [provider]  ?ALPRAZolam Duanne Moron) 0.5 MG tablet Take 0.5 mg by mouth 3 (three) times daily as needed for anxiety.     [provider]  ?amLODipine (NORVASC) 5 MG tablet Take 5 mg by mouth daily.     [provider]  ?APPLE CIDER VINEGAR PO Take 15 mLs by mouth daily.    [provider]  ?aspirin EC 81 MG tablet Take 81 mg by mouth daily.     [provider]  ?  Bacillus Coagulans-Inulin (ALIGN PREBIOTIC-PROBIOTIC) 5-1.25 MG-GM CHEW Chew 1 tablet by mouth daily. 12/20/18   Nicholas Lose, MD  ?Calcium Citrate-Vitamin D (CITRACAL + D PO) Take 1 tablet by mouth daily.     [provider]  ?Cholecalciferol (VITAMIN D3) 2000 units capsule Take 2,000 Units by mouth daily.     [provider]  ?cromolyn (OPTICROM) 4 % ophthalmic solution INT 1 GTT INTO OU TWO TO QID 03/18/15   [provider]  ?Multiple Vitamin (MULTI-VITAMINS) TABS Take by mouth.    [provider]  ?Multiple Vitamins-Minerals (PRESERVISION AREDS 2) CAPS Take 1 capsule by mouth 2 (two) times daily. 12/21/17   Nicholas Lose, MD  ?Omega-3 1000 MG CAPS Take by mouth.    [provider]  ?omeprazole (PRILOSEC) 20 MG capsule Take 20 mg by mouth daily as needed (for heartburn or acid reflux). Reported on 07/18/2015    [provider]  ?protein supplement shake (PREMIER PROTEIN) LIQD Take 2 oz by mouth  daily as needed (for bone support).    [provider]  ?tamoxifen (NOLVADEX) 20 MG tablet TAKE 1 TABLET BY MOUTH DAILY 02/27/20   Nicholas Lose, MD  ?timolol (BETIMOL) 0.25 % ophthalmic solution Place 1-2 drops into the left eye 2 (two) times daily. 12/15/16   Nicholas Lose, MD  ?Turmeric (QC TUMERIC COMPLEX) 500 MG CAPS Take 2,000 mg by mouth daily. 12/20/18   Nicholas Lose, MD  ? ? ?Allergies ?Doxycycline, Lisinopril, and Shellfish allergy ? ? ?REVIEW OF SYSTEMS  ?Negative except as noted here or in the History of Present Illness. ? ? ?PHYSICAL EXAMINATION  ?Initial Vital Signs ?Blood pressure (!) 177/101, pulse (!) 110, temperature 98 ?F (36.7 ?C), temperature source Oral, resp. rate 20, height '5\' 5"'$  (1.651 m), weight 81.2 kg, SpO2 96 %. ? ?Examination ?General: Well-developed, well-nourished female in no acute distress; appearance consistent with age of record ?HENT: normocephalic; atraumatic ?Eyes: pupils equal, round and reactive to light; extraocular muscles intact; no conjunctival pallor ?Neck: supple ?Heart: regular rate and rhythm; tachycardia ?Lungs: clear to auscultation bilaterally ?Abdomen: soft; nondistended; minimal left lower quadrant tenderness; bowel sounds present ?Rectal: Normal sphincter tone; gross blood in rectal vault ?Extremities: No deformity; full range of motion; pulses normal ?Neurologic: Awake, alert and oriented; motor function intact in all extremities and symmetric; no facial droop ?Skin: Warm and dry ?Psychiatric: Normal mood and affect ? ? ?RESULTS  ?Summary of this visit's results, reviewed and interpreted by myself: ? ? EKG Interpretation ? ?Date/Time:    ?Ventricular Rate:    ?PR Interval:    ?QRS Duration:   ?QT Interval:    ?QTC Calculation:   ?R Axis:     ?Text Interpretation:   ?  ? ?  ? ?Laboratory Studies: ?Results for orders placed or performed during the hospital encounter of 07/10/21 (from the past 24 hour(s))  ?Urinalysis, Routine w reflex microscopic Urine,  Clean Catch     Status: Abnormal  ? Collection Time: 07/10/21 12:00 AM  ?Result Value Ref Range  ? Color, Urine YELLOW YELLOW  ? APPearance CLEAR CLEAR  ? Specific Gravity, Urine 1.019 1.005 - 1.030  ? pH 5.0 5.0 - 8.0  ? Glucose, UA NEGATIVE NEGATIVE mg/dL  ? Hgb urine dipstick NEGATIVE NEGATIVE  ? Bilirubin Urine NEGATIVE NEGATIVE  ? Ketones, ur 5 (A) NEGATIVE mg/dL  ? Protein, ur NEGATIVE NEGATIVE mg/dL  ? Nitrite NEGATIVE NEGATIVE  ? Leukocytes,Ua MODERATE (A) NEGATIVE  ? RBC / HPF 0-5 0 -  5 RBC/hpf  ? WBC, UA 0-5 0 - 5 WBC/hpf  ? Bacteria, UA NONE SEEN NONE SEEN  ? Squamous Epithelial / LPF 0-5 0 - 5  ? Mucus PRESENT   ? Hyaline Casts, UA PRESENT   ?Comprehensive metabolic panel     Status: Abnormal  ? Collection Time: 07/10/21 12:54 AM  ?Result Value Ref Range  ? Sodium 135 135 - 145 mmol/L  ? Potassium 4.3 3.5 - 5.1 mmol/L  ? Chloride 105 98 - 111 mmol/L  ? CO2 23 22 - 32 mmol/L  ? Glucose, Bld 130 (H) 70 - 99 mg/dL  ? BUN 26 (H) 8 - 23 mg/dL  ? Creatinine, Ser 1.38 (H) 0.44 - 1.00 mg/dL  ? Calcium 9.1 8.9 - 10.3 mg/dL  ? Total Protein 7.1 6.5 - 8.1 g/dL  ? Albumin 3.8 3.5 - 5.0 g/dL  ? AST 55 (H) 15 - 41 U/L  ? ALT 32 0 - 44 U/L  ? Alkaline Phosphatase 99 38 - 126 U/L  ? Total Bilirubin 1.0 0.3 - 1.2 mg/dL  ? GFR, Estimated 41 (L) >60 mL/min  ? Anion gap 7 5 - 15  ?CBC with Differential     Status: Abnormal  ? Collection Time: 07/10/21 12:54 AM  ?Result Value Ref Range  ? WBC 11.6 (H) 4.0 - 10.5 K/uL  ? RBC 4.86 3.87 - 5.11 MIL/uL  ? Hemoglobin 13.7 12.0 - 15.0 g/dL  ? HCT 41.4 36.0 - 46.0 %  ? MCV 85.2 80.0 - 100.0 fL  ? MCH 28.2 26.0 - 34.0 pg  ? MCHC 33.1 30.0 - 36.0 g/dL  ? RDW 14.2 11.5 - 15.5 %  ? Platelets 262 150 - 400 K/uL  ? nRBC 0.0 0.0 - 0.2 %  ? Neutrophils Relative % 61 %  ? Neutro Abs 7.1 1.7 - 7.7 K/uL  ? Lymphocytes Relative 29 %  ? Lymphs Abs 3.4 0.7 - 4.0 K/uL  ? Monocytes Relative 7 %  ? Monocytes Absolute 0.8 0.1 - 1.0 K/uL  ? Eosinophils Relative 2 %  ? Eosinophils Absolute 0.2 0.0 -  0.5 K/uL  ? Basophils Relative 1 %  ? Basophils Absolute 0.1 0.0 - 0.1 K/uL  ? Immature Granulocytes 0 %  ? Abs Immature Granulocytes 0.03 0.00 - 0.07 K/uL  ? ?Imaging Studies: ?No results found. ? ?ED COURS

## 2021-07-10 NOTE — Consult Note (Signed)
Referring Provider: Dr. Sloan Leiter ?Primary Care Physician:  Donald Prose, MD ?Primary Gastroenterologist:  Dr. Michail Sermon ? ?Reason for Consultation:  Rectal bleed ? ?HPI: Karen Roth is a 71 y.o. female with history of sigmoid diverticulosis and small internal hemorrhoids on a colonoscopy in 02/2018 who had the acute onset of painless hematochezia with loose stools X 2 and then after the 2nd episode of bleeding she had intermittent LLQ cramping pain. Denies melena, N/V, or weakness. Hgb 13.7 on admit and 12.7 this morning. Abd/pelvis CT without any acute findings. No bleeding since arrival at hospital.   ? ?Past Medical History:  ?Diagnosis Date  ? Anxiety   ? takes Xanax daily as needed  ? Arthritis   ? Breast cancer ([place])   ? Diverticulosis   ? GERD (gastroesophageal reflux disease)   ? takes Omeprazole daily as needed  ? Headache   ? more so since diagnosis of breast cancer  ? History of bronchitis > 20 yrs ago  ? History of colon polyps   ? benign  ? History of renal failure 2013  ? saw a kidney specialist and was released a yr ago.Thought it was back related  ? Hypertension   ? takes Amlodipine daily  ? Neck pain   ? and right shoulder.Arthritis and Bone Spurs  ? Osteoarthritis   ? Personal history of radiation therapy   ? 2017  ? Urinary frequency   ? ? ?Past Surgical History:  ?Procedure Laterality Date  ? ABDOMINAL HYSTERECTOMY    ? APPENDECTOMY    ? BACK SURGERY  1971  ? Harrington Rod Placement  ? BACK SURGERY    ? BREAST LUMPECTOMY Right 06/17/2015  ? Malignant  ? BREAST LUMPECTOMY WITH RADIOACTIVE SEED AND SENTINEL LYMPH NODE BIOPSY Right 06/17/2015  ? Procedure: BREAST LUMPECTOMY WITH RADIOACTIVE SEED AND SENTINEL LYMPH NODE BIOPSY;  Surgeon: Autumn Messing III, MD;  Location: [facility];  Service: General;  Laterality: Right;  ? colonoscopy    ? TONSILLECTOMY    ? ? ?Prior to Admission medications   ?Medication Sig Start Date End Date Taking? Authorizing Provider  ?acetaminophen (TYLENOL) 500 MG tablet Take  500-1,000 mg by mouth daily as needed for mild pain or headache. Reported on 08/20/2015   Yes [provider]  ?ALPRAZolam Duanne Moron) 0.5 MG tablet Take 0.5 mg by mouth 3 (three) times daily as needed for anxiety.    Yes [provider]  ?amLODipine (NORVASC) 5 MG tablet Take 5 mg by mouth daily.    Yes [provider]  ?APPLE CIDER VINEGAR PO Take 15 mLs by mouth 3 (three) times a week.   Yes [provider]  ?azelastine (OPTIVAR) 0.05 % ophthalmic solution Apply 1 drop to eye 2 (two) times daily. 06/13/21  Yes [provider]  ?Calcium Citrate-Vitamin D (CITRACAL + D PO) Take 1 tablet by mouth daily.    Yes [provider]  ?Cholecalciferol (VITAMIN D3) 2000 units capsule Take 2,000 Units by mouth daily.    Yes [provider]  ?cromolyn (OPTICROM) 4 % ophthalmic solution Place 1 drop into both eyes 2 (two) times daily. 03/18/15  Yes [provider]  ?dorzolamide-timolol (COSOPT) 22.3-6.8 MG/ML ophthalmic solution Place 1 drop into both eyes 2 (two) times daily. 07/07/21  Yes [provider]  ?Multiple Vitamins-Minerals (PRESERVISION AREDS 2) CAPS Take 1 capsule by mouth 2 (two) times daily. 12/21/17  Yes Nicholas Lose, MD  ?Omega-3 1000 MG CAPS Take 1,000 mg by mouth daily.  Yes [provider]  ?omeprazole (PRILOSEC) 20 MG capsule Take 20 mg by mouth daily as needed (for heartburn or acid reflux). Reported on 07/18/2015   Yes [provider]  ?protein supplement shake (PREMIER PROTEIN) LIQD Take 2 oz by mouth daily.   Yes [provider]  ?saccharomyces boulardii (FLORASTOR) 250 MG capsule Take 250 mg by mouth daily.   Yes [provider]  ?Turmeric (QC TUMERIC COMPLEX) 500 MG CAPS Take 2,000 mg by mouth daily. ?Patient taking differently: Take 1,000 mg by mouth daily. 12/20/18  Yes Nicholas Lose, MD  ?Wheat Dextrin (BENEFIBER ON THE GO) PACK Take 1 Package by mouth daily. Added to coffee daily   Yes  [provider]  ? ? ?Scheduled Meds: ? sodium chloride (PF)      ? ?Continuous Infusions: ? lactated ringers 100 mL/hr at 07/10/21 0313  ? ?PRN Meds:.hydrALAZINE ? ?Allergies as of 07/10/2021 - Review Complete 07/10/2021  ?Allergen Reaction Noted  ? Doxycycline Other (See Comments) 09/17/2011  ? Lisinopril Other (See Comments) 05/15/2015  ? Shellfish allergy Hives 05/15/2015  ? ? ?Family History  ?Problem Relation Age of Onset  ? Cancer Maternal Aunt   ?     GYN  ? Breast cancer Cousin   ? ? ?Social History  ? ?Socioeconomic History  ? Marital status: Divorced  ?  Spouse name: Not on file  ? Number of children: Not on file  ? Years of education: Not on file  ? Highest education level: Not on file  ?Occupational History  ? Not on file  ?Tobacco Use  ? Smoking status: Every Day  ?  Packs/day: 0.00  ?  Years: 30.00  ?  Pack years: 0.00  ?  Types: Cigarettes  ? Smokeless tobacco: Never  ? Tobacco comments:  ?  maybe 3 a day  ?Substance and Sexual Activity  ? Alcohol use: Yes  ?  Comment: wine  nighlty  ? Drug use: No  ? Sexual activity: Not on file  ?Other Topics Concern  ? Not on file  ?Social History Narrative  ? Not on file  ? ?Social Determinants of Health  ? ?Financial Resource Strain: Not on file  ?Food Insecurity: Not on file  ?Transportation Needs: Not on file  ?Physical Activity: Not on file  ?Stress: Not on file  ?Social Connections: Not on file  ?Intimate Partner Violence: Not on file  ? ? ?Review of Systems: All negative except as stated above in HPI. ? ?Physical Exam: ?Vital signs: ?Vitals:  ? 07/10/21 1000 07/10/21 1032  ?BP:  135/76  ?Pulse: 79 80  ?Resp: 15 15  ?Temp:    ?SpO2: 99% 99%  ?T 98 ?  ?General:   Lethargic, elderly, well-nourished, pleasant and cooperative in NAD ?Head: normocephalic, atraumatic ?Eyes: anicteric sclera ?ENT: oropharynx clear ?Neck: supple, nontender ?Lungs:  Clear throughout to auscultation.   No wheezes, crackles, or rhonchi. No acute distress. ?Heart:  Regular rate  and rhythm; no murmurs, clicks, rubs,  or gallops. ?Abdomen: LLQ tenderness with guarding, otherwise nontender, soft, nondistended, +BS  ?Rectal:  Deferred ?Ext: no edema ? ?GI:  ?Lab Results: ?Recent Labs  ?  07/10/21 ?5009 07/10/21 ?0304  ?WBC 11.6* 12.6*  ?HGB 13.7 12.7  ?HCT 41.4 39.3  ?PLT 262 219  ? ?BMET ?Recent Labs  ?  07/10/21 ?3818  ?NA 135  ?K 4.3  ?CL 105  ?CO2 23  ?GLUCOSE 130*  ?BUN 26*  ?CREATININE 1.38*  ?CALCIUM 9.1  ? ?  LFT ?Recent Labs  ?  07/10/21 ?3338  ?PROT 7.1  ?ALBUMIN 3.8  ?AST 55*  ?ALT 32  ?ALKPHOS 99  ?BILITOT 1.0  ? ?PT/INR ?No results for input(s): LABPROT, INR in the last 72 hours. ? ? ?Studies/Results: ?CT ABDOMEN PELVIS W CONTRAST ? ?Result Date: 07/10/2021 ?CLINICAL DATA:  Left lower quadrant abdominal pain EXAM: CT ABDOMEN AND PELVIS WITH CONTRAST TECHNIQUE: Multidetector CT imaging of the abdomen and pelvis was performed using the standard protocol following bolus administration of intravenous contrast. RADIATION DOSE REDUCTION: This exam was performed according to the departmental dose-optimization program which includes automated exposure control, adjustment of the mA and/or kV according to patient size and/or use of iterative reconstruction technique. CONTRAST:  28m OMNIPAQUE IOHEXOL 300 MG/ML  SOLN COMPARISON:  02/19/2021 FINDINGS: Lower chest: Lung bases are clear. Hepatobiliary: Liver is within normal limits. Gallbladder is unremarkable. No intrahepatic or extrahepatic ductal dilatation. Pancreas: Within normal limits. Spleen: Within normal limits. Adrenals/Urinary Tract: 2.3 cm low-density left adrenal nodule (series 2/image 17), unchanged, compatible with a benign adrenal adenoma. Right adrenal gland is within normal limits. Kidneys are within normal limits.  No hydronephrosis. Bladder is within normal limits. Stomach/Bowel: Stomach is notable for a small hiatal hernia. No evidence of bowel obstruction. Appendix is not discretely visualized. Mild left colonic  diverticulosis, without evidence of diverticulitis. Vascular/Lymphatic: No evidence of abdominal aortic aneurysm. Atherosclerotic calcifications of the abdominal aorta and branch vessels. No suspicious abdominopelvic

## 2021-07-10 NOTE — Progress Notes (Signed)
Patient seen and examined.  In the emergency room.  Denies any complaints.  No bowel movement or rectal bleeding since arrival to the emergency room.  Hemoglobin has remained fairly stable today. ?71 year old with known diverticulosis, colonoscopy 2019, nonspecific abdominal pain in the past presented with 2 episodes of bright red blood per rectum with mild left lower quadrant pain.  CT scan with contrast with evidence of diverticulosis uncomplicated. ? ?Plan: Recheck hemoglobin then every 8 hours.  Transfusion for less than 8 if any ongoing bleeding. ?Keep on clears, [facility] was consulted at night.  If no further bleeding, may treat conservatively with known diverticulosis.  If any further bleeding, will benefit with colonoscopy. ? ?No charge visit.  Same-day admission.  Anticipated discharge tomorrow morning if stable. ?

## 2021-07-10 NOTE — ED Notes (Signed)
Patient transported to CT 

## 2021-07-10 NOTE — ED Notes (Signed)
Assisted EDP Dr. Florina Ou with rectal exam. Bright red blood was noted. No need for occult card.  ?

## 2021-07-10 NOTE — ED Notes (Signed)
Ambulated to bathroom

## 2021-07-11 DIAGNOSIS — K625 Hemorrhage of anus and rectum: Secondary | ICD-10-CM | POA: Diagnosis not present

## 2021-07-11 DIAGNOSIS — K5730 Diverticulosis of large intestine without perforation or abscess without bleeding: Secondary | ICD-10-CM | POA: Diagnosis not present

## 2021-07-11 LAB — CBC
HCT: 39.8 % (ref 36.0–46.0)
Hemoglobin: 12.6 g/dL (ref 12.0–15.0)
MCH: 27.6 pg (ref 26.0–34.0)
MCHC: 31.7 g/dL (ref 30.0–36.0)
MCV: 87.1 fL (ref 80.0–100.0)
Platelets: 231 10*3/uL (ref 150–400)
RBC: 4.57 MIL/uL (ref 3.87–5.11)
RDW: 14.6 % (ref 11.5–15.5)
WBC: 8.2 10*3/uL (ref 4.0–10.5)
nRBC: 0 % (ref 0.0–0.2)

## 2021-07-11 LAB — ABO/RH: ABO/RH(D): B POS

## 2021-07-11 NOTE — Discharge Summary (Signed)
Physician Discharge Summary  ?Karen Roth TFT:[id number] DOB: 10-23-1950 DOA: 07/10/2021 ? ?PCP: Donald Prose, MD ? ?Admit date: 07/10/2021 ?Discharge date: 07/11/2021 ? ?Admitted From: Home ?Disposition: Home ? ?Recommendations for Outpatient Follow-up:  ?Follow up with PCP in 1-2 weeks ?Please obtain BMP/CBC in one week ?GI office will schedule follow-up with you. ? ?Home Health: N/A ?Equipment/Devices: N/A ? ?Discharge Condition: Stable ?CODE STATUS: Full code ?Diet recommendation: Soft diet, low-salt diet. ? ?Discharge summary: ? ?71 year old with known diverticulosis, colonoscopy 2019, nonspecific abdominal pain in the past presented with 2 episodes of bright red blood per rectum with mild left lower quadrant pain.  CT scan with contrast with evidence of diverticulosis but not complicated.  Due to significant findings on presentation, she was admitted to the hospital and monitored.  She was treated with IV fluids, subsequent hemoglobin monitoring and GI follow-up. ?Patient had subsequently decreased amount of rectal bleeding.  Remained asymptomatic.  Hemoglobin remained stable.  GI recommended conservative management.  Today, she is asymptomatic. ? ?Plan: ?Conservative management.  Monitor symptoms at home.  Report for excess or worsening bleeding. ?[facility] will schedule outpatient follow-up. ?Patient is not on any antiplatelets or anticoagulation. ? ? ?Discharge Diagnoses:  ?Principal Problem: ?  Rectal bleeding ?Active Problems: ?  Breast cancer of lower-outer quadrant of right female breast ([place]) ?  Essential hypertension ?  Lower GI bleeding ? ? ? ?Discharge Instructions ? ?Discharge Instructions   ? ? Call MD for:   Complete by: As directed ?  ? If you notice excess bleeding  ? Diet - low sodium heart healthy   Complete by: As directed ?  ? Discharge instructions   Complete by: As directed ?  ? Small amount of bleeding is expected from nature of your diverticulosis.  Monitor at home.  If you start  noticing excess amount of bleeding, increasing in frequency and amount, call GI doctors office or visit ER.  ? Increase activity slowly   Complete by: As directed ?  ? ?  ? ?Allergies as of 07/11/2021   ? ?   Reactions  ? Doxycycline Other (See Comments)  ? Stomach burning and pain  ? Lisinopril Other (See Comments)  ? Kidney failure  ? Shellfish Allergy Hives  ? ?  ? ?  ?Medication List  ?  ? ?TAKE these medications   ? ?acetaminophen 500 MG tablet ?Commonly known as: TYLENOL ?Take 500-1,000 mg by mouth daily as needed for mild pain or headache. Reported on 08/20/2015 ?  ?ALPRAZolam 0.5 MG tablet ?Commonly known as: Duanne Moron ?Take 0.5 mg by mouth 3 (three) times daily as needed for anxiety. ?  ?amLODipine 5 MG tablet ?Commonly known as: NORVASC ?Take 5 mg by mouth daily. ?  ?APPLE CIDER VINEGAR PO ?Take 15 mLs by mouth 3 (three) times a week. ?  ?azelastine 0.05 % ophthalmic solution ?Commonly known as: OPTIVAR ?Apply 1 drop to eye 2 (two) times daily. ?  ?Benefiber On The GO Pack ?Take 1 Package by mouth daily. Added to coffee daily ?  ?CITRACAL + D PO ?Take 1 tablet by mouth daily. ?  ?cromolyn 4 % ophthalmic solution ?Commonly known as: OPTICROM ?Place 1 drop into both eyes 2 (two) times daily. ?  ?dorzolamide-timolol 22.3-6.8 MG/ML ophthalmic solution ?Commonly known as: COSOPT ?Place 1 drop into both eyes 2 (two) times daily. ?  ?Omega-3 1000 MG Caps ?Take 1,000 mg by mouth daily. ?  ?omeprazole 20 MG capsule ?Commonly known as: PRILOSEC ?Take 20 mg  by mouth daily as needed (for heartburn or acid reflux). Reported on 07/18/2015 ?  ?PreserVision AREDS 2 Caps ?Take 1 capsule by mouth 2 (two) times daily. ?  ?protein supplement shake Liqd ?Commonly known as: PREMIER PROTEIN ?Take 2 oz by mouth daily. ?  ?saccharomyces boulardii 250 MG capsule ?Commonly known as: FLORASTOR ?Take 250 mg by mouth daily. ?  ?Turmeric 500 MG Caps ?Commonly known as: QC Tumeric Complex ?Take 2,000 mg by mouth daily. ?What changed: how much to  take ?  ?Vitamin D3 50 MCG (2000 UT) capsule ?Take 2,000 Units by mouth daily. ?  ? ?  ? ? Follow-up Information   ? ? Donald Prose, MD Follow up in 2 week(s).   ?Specialty: Family Medicine ?Contact information: ?[address] ?[address] ?[place] [address] ?[id number] ? ? ?  ?  ? ?  ?  ? ?  ? ?Allergies  ?Allergen Reactions  ? Doxycycline Other (See Comments)  ?  Stomach burning and pain  ? Lisinopril Other (See Comments)  ?  Kidney failure  ? Shellfish Allergy Hives  ? ? ?Consultations: ?[facility] ? ? ?Procedures/Studies: ?CT ABDOMEN PELVIS W CONTRAST ? ?Result Date: 07/10/2021 ?CLINICAL DATA:  Left lower quadrant abdominal pain EXAM: CT ABDOMEN AND PELVIS WITH CONTRAST TECHNIQUE: Multidetector CT imaging of the abdomen and pelvis was performed using the standard protocol following bolus administration of intravenous contrast. RADIATION DOSE REDUCTION: This exam was performed according to the departmental dose-optimization program which includes automated exposure control, adjustment of the mA and/or kV according to patient size and/or use of iterative reconstruction technique. CONTRAST:  10m OMNIPAQUE IOHEXOL 300 MG/ML  SOLN COMPARISON:  02/19/2021 FINDINGS: Lower chest: Lung bases are clear. Hepatobiliary: Liver is within normal limits. Gallbladder is unremarkable. No intrahepatic or extrahepatic ductal dilatation. Pancreas: Within normal limits. Spleen: Within normal limits. Adrenals/Urinary Tract: 2.3 cm low-density left adrenal nodule (series 2/image 17), unchanged, compatible with a benign adrenal adenoma. Right adrenal gland is within normal limits. Kidneys are within normal limits.  No hydronephrosis. Bladder is within normal limits. Stomach/Bowel: Stomach is notable for a small hiatal hernia. No evidence of bowel obstruction. Appendix is not discretely visualized. Mild left colonic diverticulosis, without evidence of diverticulitis. Vascular/Lymphatic: No evidence of abdominal aortic aneurysm.  Atherosclerotic calcifications of the abdominal aorta and branch vessels. No suspicious abdominopelvic lymphadenopathy. Reproductive: Status post hysterectomy. Bilateral ovaries are within normal limits. Other: No abdominopelvic ascites. Musculoskeletal: Lumbar dextroscoliosis with thoracolumbar spine fixation hardware. IMPRESSION: No evidence of bowel obstruction. Mild left colonic diverticulosis, without evidence of diverticulitis. 2.3 cm left adrenal adenoma, benign.  No follow-up is recommended. Additional ancillary findings as above. Electronically Signed   By: SJulian HyM.D.   On: 07/10/2021 04:08   ?(Echo, Carotid, EGD, Colonoscopy, ERCP)  ? ? ?Subjective: Patient seen and examined in the morning rounds.  Denies any complaints.  She had 2 bowel movement since admission, last 1 last night with stool mixed with small amount of bright red blood.  Denies any nausea vomiting or abdominal pain or distention.  Eager to go home. ? ? ?Discharge Exam: ?Vitals:  ? 07/11/21 0153 07/11/21 0542  ?BP: (!) 118/55 (!) 142/84  ?Pulse: 76 69  ?Resp: 15 14  ?Temp: 98.2 ?F (36.8 ?C) 98 ?F (36.7 ?C)  ?SpO2: 100% 99%  ? ?Vitals:  ? 07/10/21 1808 07/10/21 2151 07/11/21 0153 07/11/21 0542  ?BP: 139/79 (!) 143/79 (!) 118/55 (!) 142/84  ?Pulse: 69 77 76 69  ?Resp: 18  $'15 15 14  'r$ ?Temp: 97.7 ?F (36.5 ?C) 98.3 ?F (36.8 ?C) 98.2 ?F (36.8 ?C) 98 ?F (36.7 ?C)  ?TempSrc: Oral Oral Oral Oral  ?SpO2: 100% 100% 100% 99%  ?Weight:      ?Height:      ? ? ?General: Pt is alert, awake, not in acute distress ?Cardiovascular: RRR, S1/S2 +, no rubs, no gallops ?Respiratory: CTA bilaterally, no wheezing, no rhonchi ?Abdominal: Soft, NT, ND, bowel sounds + ?Extremities: no edema, no cyanosis ? ? ? ?The results of significant diagnostics from this hospitalization (including imaging, microbiology, ancillary and laboratory) are listed below for reference.   ? ? ?Microbiology: ?Recent Results (from the past 240 hour(s))  ?Resp Panel by RT-PCR (Flu  A&B, Covid) Nasopharyngeal Swab     Status: None  ? Collection Time: 07/10/21  2:13 AM  ? Specimen: Nasopharyngeal Swab; Nasopharyngeal(NP) swabs in vial transport medium  ?Result Value Ref Range Status  ? SAR

## 2021-07-11 NOTE — Progress Notes (Signed)
[facility] Progress Note ? ?Karen Roth 71 y.o. 1950/04/26 ? ?CC:  Rectal bleeding ? ? ?Subjective: ?Patient resting comfortably in bed this morning. States there was blood in her stool this morning but amount of blood was less than yesterday. Stool was formed. She denies abdominal pain, nausea, vomiting, diarrhea, constipation, fever, chills. Patient has been walking the halls and denies weakness or fatigue. Tolerating clear liquid diet. She is hungry and would like to advance diet. ? ?ROS : Review of Systems  ?Constitutional:  Negative for chills and fever.  ?Gastrointestinal:  Positive for blood in stool. Negative for abdominal pain, constipation, diarrhea, heartburn, melena, nausea and vomiting.  ?Genitourinary:  Negative for dysuria and urgency.   ? ? ?Objective: ?Vital signs in last 24 hours: ?Vitals:  ? 07/11/21 0153 07/11/21 0542  ?BP: (!) 118/55 (!) 142/84  ?Pulse: 76 69  ?Resp: 15 14  ?Temp: 98.2 ?F (36.8 ?C) 98 ?F (36.7 ?C)  ?SpO2: 100% 99%  ? ? ?Physical Exam: ? ?General:  Alert, cooperative, no distress, appears stated age  ?Head:  Normocephalic, without obvious abnormality, atraumatic  ?Eyes:  Anicteric sclera, EOM's intact  ?Lungs:   Clear to auscultation bilaterally, respirations unlabored  ?Heart:  Regular rate and rhythm, S1, S2 normal  ?Abdomen:   Soft, non-tender, bowel sounds active all four quadrants,  no masses,   ?Extremities: Extremities normal, atraumatic, no  edema  ?Pulses: 2+ and symmetric  ? ? ?Lab Results: ?Recent Labs  ?  07/10/21 ?0141  ?NA 135  ?K 4.3  ?CL 105  ?CO2 23  ?GLUCOSE 130*  ?BUN 26*  ?CREATININE 1.38*  ?CALCIUM 9.1  ? ?Recent Labs  ?  07/10/21 ?0301  ?AST 55*  ?ALT 32  ?ALKPHOS 99  ?BILITOT 1.0  ?PROT 7.1  ?ALBUMIN 3.8  ? ?Recent Labs  ?  07/10/21 ?3143 07/10/21 ?0304 07/10/21 ?2234 07/11/21 ?8887  ?WBC 11.6*   < > 8.1 8.2  ?NEUTROABS 7.1  --   --   --   ?HGB 13.7   < > 11.5* 12.6  ?HCT 41.4   < > 35.3* 39.8  ?MCV 85.2   < > 85.9 87.1  ?PLT 262   < >  225 231  ? < > = values in this interval not displayed.  ? ?No results for input(s): LABPROT, INR in the last 72 hours. ? ? ? ?Assessment ?Rectal bleeding ?- Hgb 12.6, WBC 8.2, Platelets 231 ?- CT A/P 3/23 shows diverticulosis without evidence of diverticulitis, no bowel obstruction, no acute findings ?- Last colonoscopy 02/2018 showed diverticulosis and small internal hemorrhoids. ? ? ?Plan: ?Likely diverticular bleed. No abdominal pain. Blood in BM this morning but decreased from yesterday. Stool formed. Hgb 12.6.  ?Repeat colonoscopy not recommended at this time. ?Can advance to soft diet as tolerated.  ?[facility] will sign off. Please contact us if we can be of any further assistance during this hospital stay.  ? ?Angelique Holm PA-C ?07/11/2021, 9:36 AM ? ?Contact #  [phone]  ?

## 2021-07-11 NOTE — Progress Notes (Signed)
Patient will be discharging home soon. Belongings were returned. Education on medication will be provided.  ?

## 2021-07-11 NOTE — Progress Notes (Signed)
Pt states she had bloody stool this a.m ?

## 2021-07-15 DIAGNOSIS — K922 Gastrointestinal hemorrhage, unspecified: Secondary | ICD-10-CM | POA: Diagnosis not present

## 2021-07-15 DIAGNOSIS — I129 Hypertensive chronic kidney disease with stage 1 through stage 4 chronic kidney disease, or unspecified chronic kidney disease: Secondary | ICD-10-CM | POA: Diagnosis not present

## 2021-07-15 DIAGNOSIS — M419 Scoliosis, unspecified: Secondary | ICD-10-CM | POA: Diagnosis not present

## 2021-07-15 DIAGNOSIS — C50919 Malignant neoplasm of unspecified site of unspecified female breast: Secondary | ICD-10-CM | POA: Diagnosis not present

## 2021-07-15 DIAGNOSIS — N182 Chronic kidney disease, stage 2 (mild): Secondary | ICD-10-CM | POA: Diagnosis not present

## 2021-07-16 DIAGNOSIS — Z961 Presence of intraocular lens: Secondary | ICD-10-CM | POA: Diagnosis not present

## 2021-07-16 DIAGNOSIS — H43811 Vitreous degeneration, right eye: Secondary | ICD-10-CM | POA: Diagnosis not present

## 2021-07-16 DIAGNOSIS — H18513 Endothelial corneal dystrophy, bilateral: Secondary | ICD-10-CM | POA: Diagnosis not present

## 2021-07-16 DIAGNOSIS — H35033 Hypertensive retinopathy, bilateral: Secondary | ICD-10-CM | POA: Diagnosis not present

## 2021-07-16 DIAGNOSIS — H353132 Nonexudative age-related macular degeneration, bilateral, intermediate dry stage: Secondary | ICD-10-CM | POA: Diagnosis not present

## 2021-07-16 DIAGNOSIS — H401231 Low-tension glaucoma, bilateral, mild stage: Secondary | ICD-10-CM | POA: Diagnosis not present

## 2021-08-06 DIAGNOSIS — I129 Hypertensive chronic kidney disease with stage 1 through stage 4 chronic kidney disease, or unspecified chronic kidney disease: Secondary | ICD-10-CM | POA: Diagnosis not present

## 2021-08-06 DIAGNOSIS — N182 Chronic kidney disease, stage 2 (mild): Secondary | ICD-10-CM | POA: Diagnosis not present

## 2021-08-14 DIAGNOSIS — K625 Hemorrhage of anus and rectum: Secondary | ICD-10-CM | POA: Diagnosis not present

## 2021-11-18 DIAGNOSIS — H0102A Squamous blepharitis right eye, upper and lower eyelids: Secondary | ICD-10-CM | POA: Diagnosis not present

## 2021-11-18 DIAGNOSIS — H16223 Keratoconjunctivitis sicca, not specified as Sjogren's, bilateral: Secondary | ICD-10-CM | POA: Diagnosis not present

## 2021-11-18 DIAGNOSIS — H401231 Low-tension glaucoma, bilateral, mild stage: Secondary | ICD-10-CM | POA: Diagnosis not present

## 2021-11-18 DIAGNOSIS — H43811 Vitreous degeneration, right eye: Secondary | ICD-10-CM | POA: Diagnosis not present

## 2021-11-18 DIAGNOSIS — Z961 Presence of intraocular lens: Secondary | ICD-10-CM | POA: Diagnosis not present

## 2021-11-18 DIAGNOSIS — H35033 Hypertensive retinopathy, bilateral: Secondary | ICD-10-CM | POA: Diagnosis not present

## 2021-11-18 DIAGNOSIS — H353132 Nonexudative age-related macular degeneration, bilateral, intermediate dry stage: Secondary | ICD-10-CM | POA: Diagnosis not present

## 2021-11-18 DIAGNOSIS — H0102B Squamous blepharitis left eye, upper and lower eyelids: Secondary | ICD-10-CM | POA: Diagnosis not present

## 2021-11-18 DIAGNOSIS — H18513 Endothelial corneal dystrophy, bilateral: Secondary | ICD-10-CM | POA: Diagnosis not present

## 2021-12-07 IMAGING — MG DIGITAL DIAGNOSTIC BILAT W/ TOMO W/ CAD
6 of 9 series · 6 of 25 positions shown · non-contrast
Comparison: Previous exam(s).

CLINICAL DATA: Malignant lumpectomy the LOWER OUTER QUADRANT of the
RIGHT breast in 0230, pathology grade 1 invasive ductal carcinoma
with perineural invasion, with adjuvant radiation therapy. Annual
evaluation.

EXAM:
DIGITAL DIAGNOSTIC BILATERAL MAMMOGRAM WITH CAD AND TOMO

[R CC]
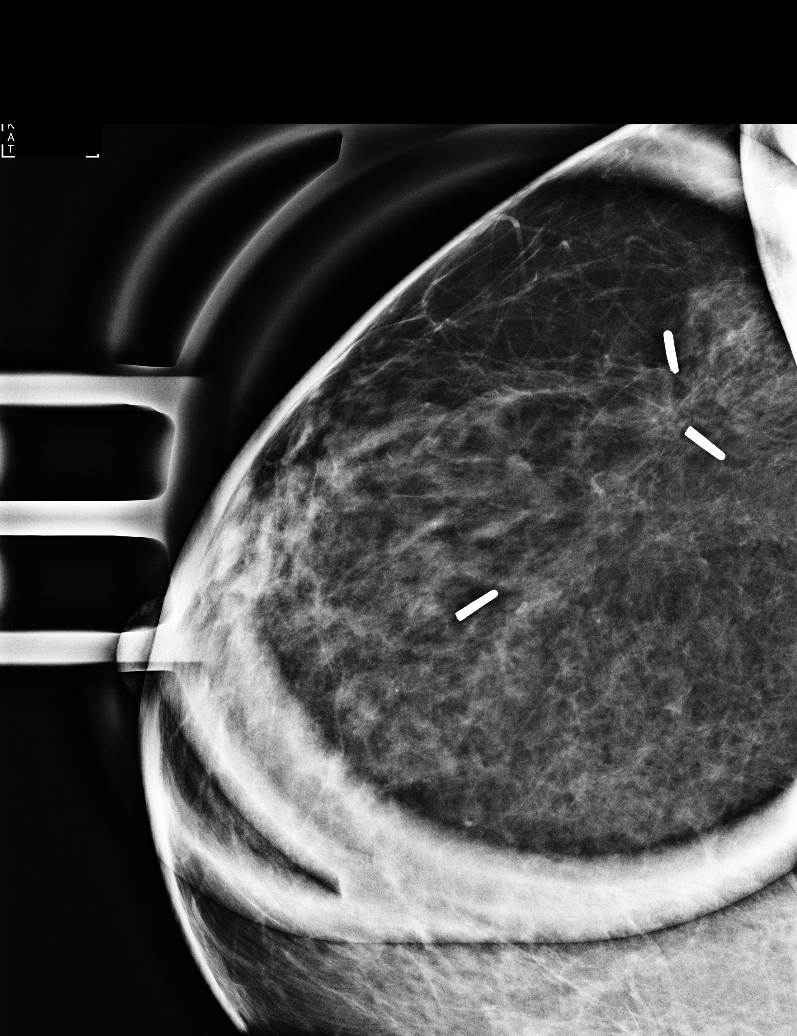

[R CC synth-2D]
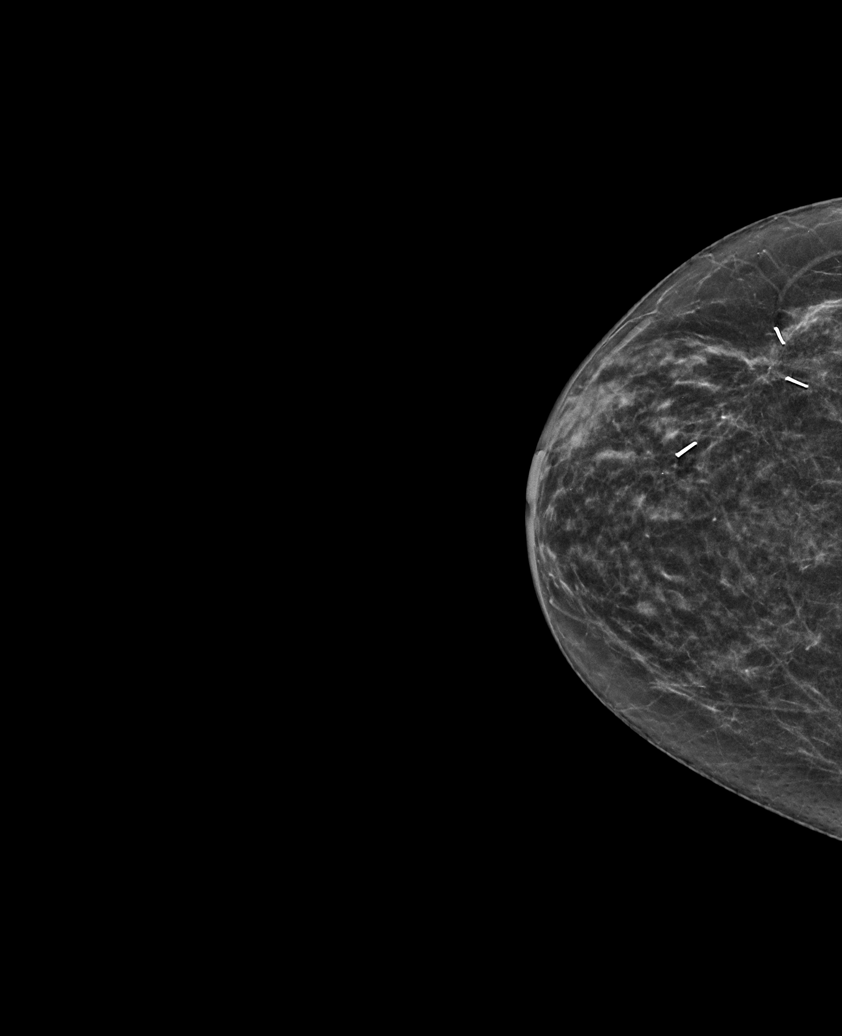

[L MLO synth-2D]
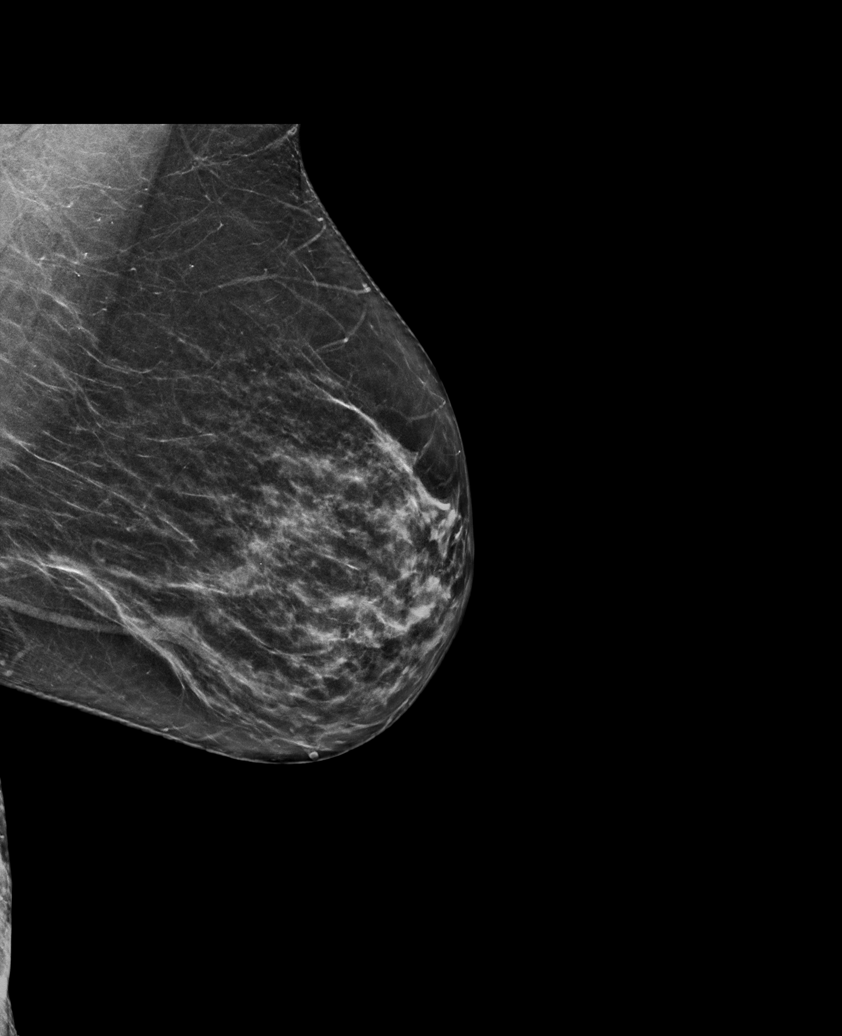

[L CC synth-2D]
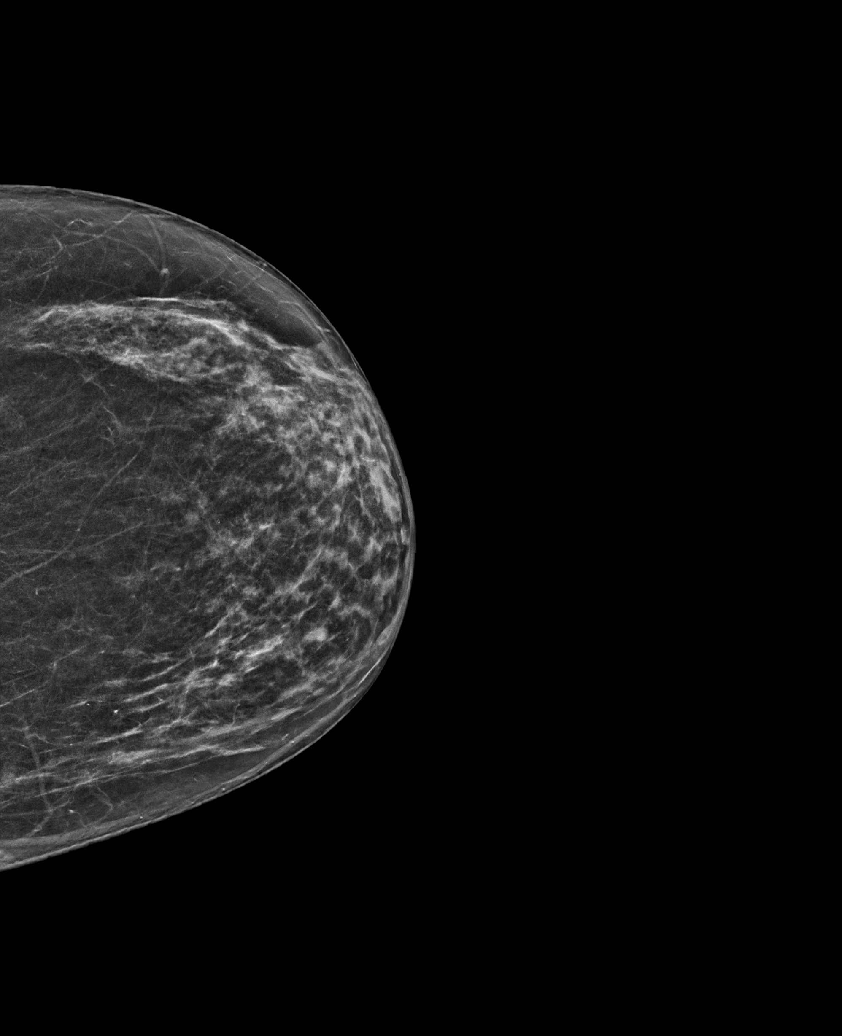

[R MLO synth-2D]
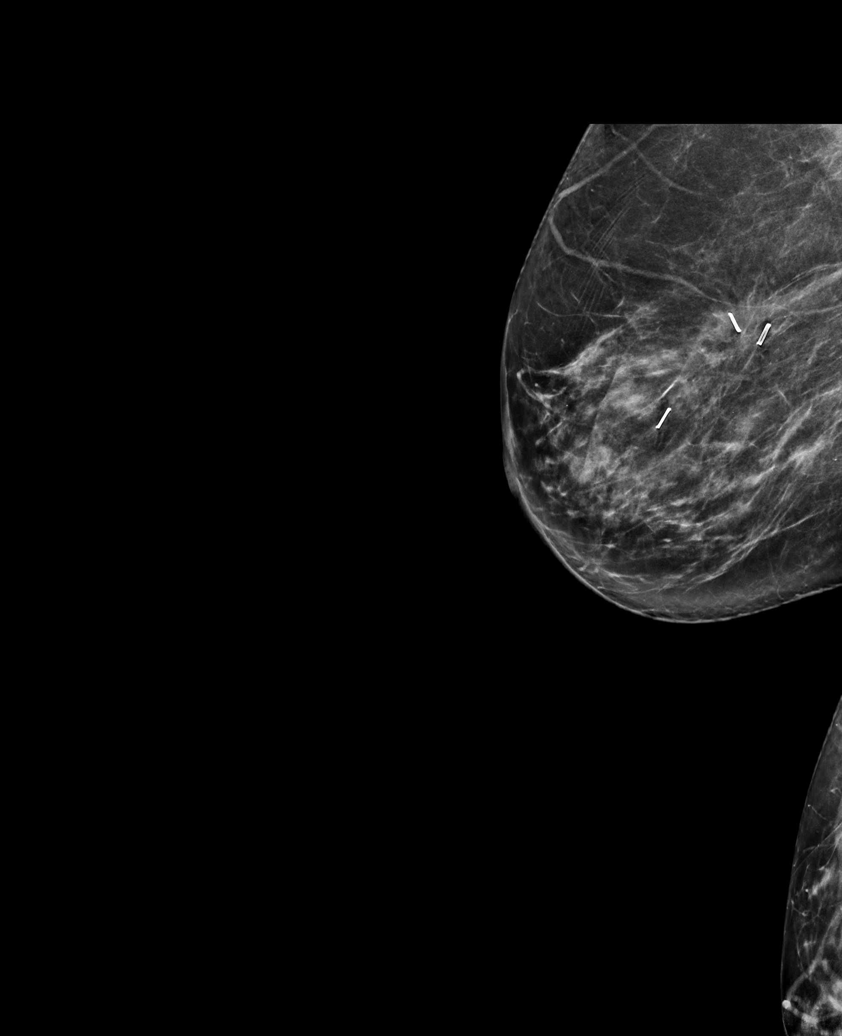

[L CC tomo · tomo slice 29/56.0]
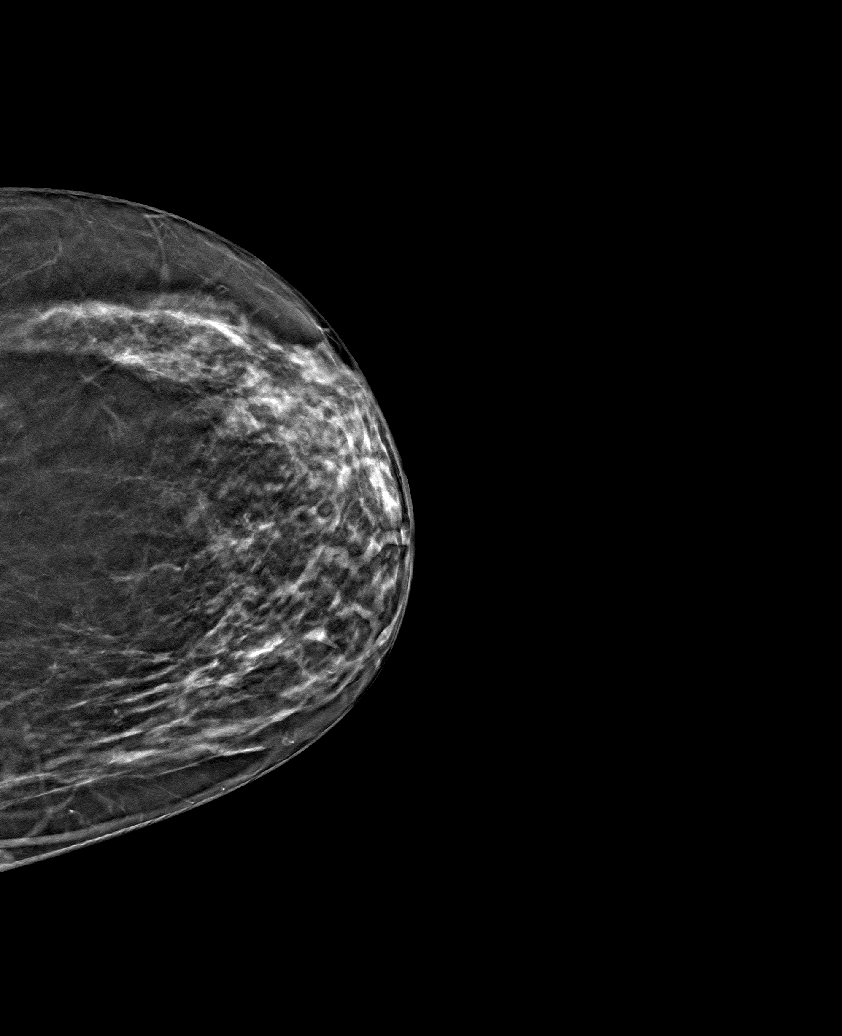

[6 of 25 positions shown; findings below may reference images not displayed]

ACR Breast Density Category c: The breast tissue is heterogeneously
dense, which may obscure small masses.
FINDINGS: Tomosynthesis and synthesized full field CC and MLO views of both
breasts were obtained. Standard spot magnification CC view of the
lumpectomy site in the RIGHT breast was also obtained.

Post surgical scar/architectural distortion at the lumpectomy site
in the LOWER OUTER RIGHT breast at MIDDLE to POSTERIOR depth. No new
or suspicious findings in the RIGHT breast.

No findings suspicious for malignancy in the LEFT breast.

Mammographic images were processed with CAD.
IMPRESSION: 1. No mammographic evidence of malignancy involving either breast.
2. Expected post lumpectomy changes involving the RIGHT breast.

RECOMMENDATION:
BILATERAL diagnostic mammography in 1 year.

I have discussed the findings and recommendations with the patient.
If applicable, a reminder letter will be sent to the patient
regarding the next appointment.

BI-RADS CATEGORY  2: Benign.

## 2021-12-16 DIAGNOSIS — Z86000 Personal history of in-situ neoplasm of breast: Secondary | ICD-10-CM | POA: Diagnosis not present

## 2021-12-16 DIAGNOSIS — R7303 Prediabetes: Secondary | ICD-10-CM | POA: Diagnosis not present

## 2021-12-16 DIAGNOSIS — N1831 Chronic kidney disease, stage 3a: Secondary | ICD-10-CM | POA: Diagnosis not present

## 2021-12-16 DIAGNOSIS — Z1331 Encounter for screening for depression: Secondary | ICD-10-CM | POA: Diagnosis not present

## 2021-12-16 DIAGNOSIS — Z0000 Encounter for general adult medical examination without abnormal findings: Secondary | ICD-10-CM | POA: Diagnosis not present

## 2021-12-16 DIAGNOSIS — K219 Gastro-esophageal reflux disease without esophagitis: Secondary | ICD-10-CM | POA: Diagnosis not present

## 2021-12-16 DIAGNOSIS — R600 Localized edema: Secondary | ICD-10-CM | POA: Diagnosis not present

## 2021-12-16 DIAGNOSIS — M8580 Other specified disorders of bone density and structure, unspecified site: Secondary | ICD-10-CM | POA: Diagnosis not present

## 2021-12-16 DIAGNOSIS — I129 Hypertensive chronic kidney disease with stage 1 through stage 4 chronic kidney disease, or unspecified chronic kidney disease: Secondary | ICD-10-CM | POA: Diagnosis not present

## 2021-12-26 NOTE — Progress Notes (Signed)
Patient Care Team: Donald Prose, MD as PCP - General (Family Medicine) Jovita Kussmaul, MD as Consulting Physician (General Surgery) Nicholas Lose, MD as Consulting Physician (Hematology and Oncology) Thea Silversmith, MD as Consulting Physician (Radiation Oncology) Sylvan Cheese, NP as Nurse Practitioner (Hematology and Oncology)  DIAGNOSIS: No diagnosis found.  SUMMARY OF ONCOLOGIC HISTORY: Oncology History  Breast cancer of lower-outer quadrant of right female breast ([place])  05/03/2015 Mammogram   Right breast tomo: Distortion at 8:30 position: 1.3 x 1.5 x 1.6 cm by ultrasound, additional mass superficial 9 mm size, low density benign-appearing   05/03/2015 Initial Diagnosis   Right breast biopsy 8:30 position: Invasive ductal carcinoma with perineural invasion, grade 1, ER 100%, PR 40%, Ki-67 20%, HER-2 negative ratio 1.26   05/03/2015 Clinical Stage   Stage IA: T1c N0   06/17/2015 Surgery   Rt Lumpectomy: IDC with DCIS 1.5 cm , 0/3 LN Neg, grade 2, ER 100%, PR 40%, Ki-67 20%, HER-2 negative ratio 1.26, margins neg   06/17/2015 Pathologic Stage   Stage IA: T1c N0   06/17/2015 Oncotype testing   RS 19 (12% ROR)   07/31/2015 - 08/21/2015 Radiation Therapy   Adjuvant radiation therapy: Right breast/ 42.72 Gy at 2.67 Gy per fraction x 21 fractions.    09/06/2015 -  Anti-estrogen oral therapy   Tamoxifen 10 mg daily.  Planned duration of therapy 5 years.   10/03/2015 Survivorship   SCP visit completed     CHIEF COMPLIANT:  Follow-up of right breast cancer on tamoxifen therapy  INTERVAL HISTORY: Karen Roth is a  71 y.o. with above-mentioned history of right breast cancer treated with lumpectomy, radiation, and who is currently on tamoxifen therapy. Mammogram on 05/17/2020 showed no evidence of malignancy bilaterally. She presents to the clinic today for annual follow-up.    ALLERGIES:  is allergic to doxycycline, lisinopril, and shellfish  allergy.  MEDICATIONS:  Current Outpatient Medications  Medication Sig Dispense Refill   acetaminophen (TYLENOL) 500 MG tablet Take 500-1,000 mg by mouth daily as needed for mild pain or headache. Reported on 08/20/2015     ALPRAZolam (XANAX) 0.5 MG tablet Take 0.5 mg by mouth 3 (three) times daily as needed for anxiety.      amLODipine (NORVASC) 5 MG tablet Take 5 mg by mouth daily.      APPLE CIDER VINEGAR PO Take 15 mLs by mouth 3 (three) times a week.     azelastine (OPTIVAR) 0.05 % ophthalmic solution Apply 1 drop to eye 2 (two) times daily.     Calcium Citrate-Vitamin D (CITRACAL + D PO) Take 1 tablet by mouth daily.      Cholecalciferol (VITAMIN D3) 2000 units capsule Take 2,000 Units by mouth daily.      cromolyn (OPTICROM) 4 % ophthalmic solution Place 1 drop into both eyes 2 (two) times daily.  3   dorzolamide-timolol (COSOPT) 22.3-6.8 MG/ML ophthalmic solution Place 1 drop into both eyes 2 (two) times daily.     Multiple Vitamins-Minerals (PRESERVISION AREDS 2) CAPS Take 1 capsule by mouth 2 (two) times daily.     Omega-3 1000 MG CAPS Take 1,000 mg by mouth daily.     omeprazole (PRILOSEC) 20 MG capsule Take 20 mg by mouth daily as needed (for heartburn or acid reflux). Reported on 07/18/2015     protein supplement shake (PREMIER PROTEIN) LIQD Take 2 oz by mouth daily.     saccharomyces boulardii (FLORASTOR) 250 MG capsule Take 250 mg by mouth  daily.     Turmeric (QC TUMERIC COMPLEX) 500 MG CAPS Take 2,000 mg by mouth daily. (Patient taking differently: Take 1,000 mg by mouth daily.)     Wheat Dextrin (BENEFIBER ON THE GO) PACK Take 1 Package by mouth daily. Added to coffee daily     No current facility-administered medications for this visit.    PHYSICAL EXAMINATION: ECOG PERFORMANCE STATUS: {CHL ONC ECOG [phone]}  There were no vitals filed for this visit. There were no vitals filed for this visit.  BREAST:*** No palpable masses or nodules in either right or left  breasts. No palpable axillary supraclavicular or infraclavicular adenopathy no breast tenderness or nipple discharge. (exam performed in the presence of a chaperone)  LABORATORY DATA:  I have reviewed the data as listed    Latest Ref Rng & Units 07/10/2021   12:54 AM 06/10/2015   10:41 AM 05/15/2015    9:10 AM  CMP  Glucose 70 - 99 mg/dL 130  98  68   BUN 8 - 23 mg/dL 26  18  17.7   Creatinine 0.44 - 1.00 mg/dL 1.38  1.06  1.3   Sodium 135 - 145 mmol/L 135  139  141   Potassium 3.5 - 5.1 mmol/L 4.3  3.6  4.3   Chloride 98 - 111 mmol/L 105  103    CO2 22 - 32 mmol/L 23  26  28    Calcium 8.9 - 10.3 mg/dL 9.1  10.0  10.0   Total Protein 6.5 - 8.1 g/dL 7.1   7.7   Total Bilirubin 0.3 - 1.2 mg/dL 1.0   <0.30   Alkaline Phos 38 - 126 U/L 99   60   AST 15 - 41 U/L 55   19   ALT 0 - 44 U/L 32   18     Lab Results  Component Value Date   WBC 8.2 07/11/2021   HGB 12.6 07/11/2021   HCT 39.8 07/11/2021   MCV 87.1 07/11/2021   PLT 231 07/11/2021   NEUTROABS 7.1 07/10/2021    ASSESSMENT & PLAN:  No problem-specific Assessment & Plan notes found for this encounter.    No orders of the defined types were placed in this encounter.  The patient has a good understanding of the overall plan. she agrees with it. she will call with any problems that may develop before the next visit here. Total time spent: 30 mins including face to face time and time spent for planning, charting and co-ordination of care   Suzzette Righter, [place] 12/26/21   I Gardiner Coins am scribing for Dr. Lindi Adie  ***

## 2021-12-30 ENCOUNTER — Other Ambulatory Visit: Payer: Self-pay

## 2021-12-30 ENCOUNTER — Inpatient Hospital Stay: Payer: Medicare PPO | Attending: Hematology and Oncology | Admitting: Hematology and Oncology

## 2021-12-30 DIAGNOSIS — C50511 Malignant neoplasm of lower-outer quadrant of right female breast: Secondary | ICD-10-CM | POA: Diagnosis not present

## 2021-12-30 DIAGNOSIS — Z79810 Long term (current) use of selective estrogen receptor modulators (SERMs): Secondary | ICD-10-CM | POA: Diagnosis not present

## 2021-12-30 DIAGNOSIS — Z170 Estrogen receptor positive status [ER+]: Secondary | ICD-10-CM | POA: Diagnosis not present

## 2021-12-30 NOTE — Assessment & Plan Note (Addendum)
Breast cancer of lower-outer quadrant of right female breast (Karen Roth) Rt Lumpectomy 06/17/15: IDC with DCIS 1.5 cm , 0/3 LN Neg, T1cN0 (Stage 1 A) grade 1, ER 100%, PR 40%, Ki-67 20%, HER-2 negative ratio 1.26, margins Neg, Oncotype DX score 19, 12% ROR Adjuvant radiation therapy from 07/31/2015 to 08/21/2015  Current Treatment: Completed 5 years of tamoxifen.  Breast cancer index: No benefit from extended endocrine therapy  Breast cancer surveillance: 1.Breast exam:  12/30/2021 benign  2.mammogram 05/17/2020: Benign breast density category C 3.bone density test which showed a T score of -2 osteopenia which has not changed for the past 6 years  4.  CT CAP 07/10/2021: No evidence of bowel obstruction or diverticulitis.  2.3 cm left adrenal adenoma benign  She exercises at the [facility] and stays very active.  Return to clinic on an as-needed basis

## 2022-01-12 ENCOUNTER — Encounter (INDEPENDENT_AMBULATORY_CARE_PROVIDER_SITE_OTHER): Payer: Medicare PPO | Admitting: Ophthalmology

## 2022-01-12 DIAGNOSIS — H353132 Nonexudative age-related macular degeneration, bilateral, intermediate dry stage: Secondary | ICD-10-CM | POA: Diagnosis not present

## 2022-01-12 DIAGNOSIS — I10 Essential (primary) hypertension: Secondary | ICD-10-CM

## 2022-01-12 DIAGNOSIS — H35033 Hypertensive retinopathy, bilateral: Secondary | ICD-10-CM | POA: Diagnosis not present

## 2022-01-12 DIAGNOSIS — H43813 Vitreous degeneration, bilateral: Secondary | ICD-10-CM

## 2022-03-23 DIAGNOSIS — H43811 Vitreous degeneration, right eye: Secondary | ICD-10-CM | POA: Diagnosis not present

## 2022-03-23 DIAGNOSIS — H0102A Squamous blepharitis right eye, upper and lower eyelids: Secondary | ICD-10-CM | POA: Diagnosis not present

## 2022-03-23 DIAGNOSIS — H35033 Hypertensive retinopathy, bilateral: Secondary | ICD-10-CM | POA: Diagnosis not present

## 2022-03-23 DIAGNOSIS — Z961 Presence of intraocular lens: Secondary | ICD-10-CM | POA: Diagnosis not present

## 2022-03-23 DIAGNOSIS — H353132 Nonexudative age-related macular degeneration, bilateral, intermediate dry stage: Secondary | ICD-10-CM | POA: Diagnosis not present

## 2022-03-23 DIAGNOSIS — H0102B Squamous blepharitis left eye, upper and lower eyelids: Secondary | ICD-10-CM | POA: Diagnosis not present

## 2022-03-23 DIAGNOSIS — H401231 Low-tension glaucoma, bilateral, mild stage: Secondary | ICD-10-CM | POA: Diagnosis not present

## 2022-03-23 DIAGNOSIS — H18513 Endothelial corneal dystrophy, bilateral: Secondary | ICD-10-CM | POA: Diagnosis not present

## 2022-04-22 DIAGNOSIS — Z170 Estrogen receptor positive status [ER+]: Secondary | ICD-10-CM | POA: Diagnosis not present

## 2022-04-22 DIAGNOSIS — C50511 Malignant neoplasm of lower-outer quadrant of right female breast: Secondary | ICD-10-CM | POA: Diagnosis not present

## 2022-06-01 ENCOUNTER — Emergency Department (HOSPITAL_COMMUNITY)
Admission: EM | Admit: 2022-06-01 | Discharge: 2022-06-02 | Disposition: A | Discharge status: Home / Self Care | Payer: Medicare PPO | Attending: Emergency Medicine | Admitting: Emergency Medicine

## 2022-06-01 ENCOUNTER — Emergency Department (HOSPITAL_COMMUNITY): Payer: Medicare PPO

## 2022-06-01 ENCOUNTER — Encounter (HOSPITAL_COMMUNITY): Payer: Self-pay

## 2022-06-01 ENCOUNTER — Other Ambulatory Visit: Payer: Self-pay

## 2022-06-01 DIAGNOSIS — I10 Essential (primary) hypertension: Secondary | ICD-10-CM | POA: Diagnosis not present

## 2022-06-01 DIAGNOSIS — R42 Dizziness and giddiness: Secondary | ICD-10-CM | POA: Insufficient documentation

## 2022-06-01 DIAGNOSIS — R519 Headache, unspecified: Secondary | ICD-10-CM | POA: Diagnosis not present

## 2022-06-01 DIAGNOSIS — Z79899 Other long term (current) drug therapy: Secondary | ICD-10-CM | POA: Diagnosis not present

## 2022-06-01 DIAGNOSIS — R079 Chest pain, unspecified: Secondary | ICD-10-CM | POA: Diagnosis not present

## 2022-06-01 LAB — BASIC METABOLIC PANEL
Anion gap: 12 (ref 5–15)
BUN: 11 mg/dL (ref 8–23)
CO2: 24 mmol/L (ref 22–32)
Calcium: 9.7 mg/dL (ref 8.9–10.3)
Chloride: 103 mmol/L (ref 98–111)
Creatinine, Ser: 1.01 mg/dL — ABNORMAL HIGH (ref 0.44–1.00)
GFR, Estimated: >60 mL/min (ref >60)
Glucose, Bld: 113 mg/dL — ABNORMAL HIGH (ref 70–99)
Potassium: 3.8 mmol/L (ref 3.5–5.1)
Sodium: 139 mmol/L (ref 135–145)

## 2022-06-01 LAB — CBC WITH DIFFERENTIAL/PLATELET
Abs Immature Granulocytes: 0.02 10*3/uL (ref 0.00–0.07)
Basophils Absolute: 0.1 10*3/uL (ref 0.0–0.1)
Basophils Relative: 0 %
Eosinophils Absolute: 0.2 10*3/uL (ref 0.0–0.5)
Eosinophils Relative: 2 %
HCT: 46.3 % — ABNORMAL HIGH (ref 36.0–46.0)
Hemoglobin: 15.1 g/dL — ABNORMAL HIGH (ref 12.0–15.0)
Immature Granulocytes: 0 %
Lymphocytes Relative: 19 %
Lymphs Abs: 2.3 10*3/uL (ref 0.7–4.0)
MCH: 27.9 pg (ref 26.0–34.0)
MCHC: 32.6 g/dL (ref 30.0–36.0)
MCV: 85.4 fL (ref 80.0–100.0)
Monocytes Absolute: 0.8 10*3/uL (ref 0.1–1.0)
Monocytes Relative: 7 %
Neutro Abs: 8.5 10*3/uL — ABNORMAL HIGH (ref 1.7–7.7)
Neutrophils Relative %: 72 %
Platelets: 245 10*3/uL (ref 150–400)
RBC: 5.42 MIL/uL — ABNORMAL HIGH (ref 3.87–5.11)
RDW: 14.6 % (ref 11.5–15.5)
WBC: 11.8 10*3/uL — ABNORMAL HIGH (ref 4.0–10.5)
nRBC: 0 % (ref 0.0–0.2)

## 2022-06-01 LAB — TROPONIN I (HIGH SENSITIVITY): Troponin I (High Sensitivity): 7 ng/L (ref <18)

## 2022-06-01 MED ORDER — LABETALOL HCL 5 MG/ML IV SOLN
10.0000 mg | Freq: Once | INTRAVENOUS | Status: AC
  Administered 2022-06-01: 10 mg via INTRAVENOUS
  Filled 2022-06-01: qty 4

## 2022-06-01 NOTE — ED Provider Triage Note (Signed)
Emergency Medicine Provider Triage Evaluation Note  Karen Roth , a 72 y.o. female  was evaluated in triage.  Pt complains of elevated blood pressure associated with chest pain, dizziness, and headache.  History of hypertension.  She has been compliant with her medications.  Denies visual changes, speech changes, and unilateral weakness.  Review of Systems  Positive: Headache, CP Negative: fever  Physical Exam  BP (!) 220/99 (BP Location: Right Arm)   Pulse 97   Temp 98.2 F (36.8 C) (Oral)   Resp 20   Wt 81 kg   SpO2 96%   BMI 29.72 kg/m  Gen:   Awake, no distress   Resp:  Normal effort  MSK:   Moves extremities without difficulty  Other:  Nonfocal neuro exam  Medical Decision Making  Medically screening exam initiated at 9:31 PM.  Appropriate orders placed.  Karen Roth was informed that the remainder of the evaluation will be completed by another provider, this initial triage assessment does not replace that evaluation, and the importance of remaining in the ED until their evaluation is complete.  Ct head Labs to rule out end organ damage Cardiac labs   Karie Kirks 06/01/22 2132

## 2022-06-01 NOTE — ED Triage Notes (Signed)
C/o left arm pain radiating into left chest with dizziness and head pressure.  Pt reports bp increased today.  Pt reports no missed doses of HTN meds at home.  Denies sob

## 2022-06-01 NOTE — ED Provider Notes (Signed)
[place] EMERGENCY DEPARTMENT AT [facility] Provider Note   CSN: [id number] Arrival date & time: 06/01/22  2059     History {Add pertinent medical, surgical, social history, OB history to HPI:1} Chief Complaint  Patient presents with   Hypertension    Karen Roth is a 72 y.o. female.  Patient presents to the emergency department for evaluation of elevated blood pressure.  Patient reports that she woke up this morning with a headache and then during the day she started to notice some aching pain in the left side of her neck and in her left arm.  No chest pain or shortness of breath.  She reports that she felt dizzy and off balance so she checked her blood pressure and it was elevated.  She checked it multiple times and it stayed high so she came to the ED.       Home Medications Prior to Admission medications   Medication Sig Start Date End Date Taking? Authorizing Provider  acetaminophen (TYLENOL) 500 MG tablet Take 500-1,000 mg by mouth daily as needed for mild pain or headache. Reported on 08/20/2015    [provider]  ALPRAZolam Duanne Moron) 0.5 MG tablet Take 0.5 mg by mouth 3 (three) times daily as needed for anxiety.     [provider]  amLODipine (NORVASC) 5 MG tablet Take 5 mg by mouth daily.     [provider]  APPLE CIDER VINEGAR PO Take 15 mLs by mouth 3 (three) times a week.    [provider]  azelastine (OPTIVAR) 0.05 % ophthalmic solution Apply 1 drop to eye 2 (two) times daily. 06/13/21   [provider]  Calcium Citrate-Vitamin D (CITRACAL + D PO) Take 1 tablet by mouth daily.     [provider]  Cholecalciferol (VITAMIN D3) 2000 units capsule Take 2,000 Units by mouth daily.     [provider]  dorzolamide-timolol (COSOPT) 22.3-6.8 MG/ML ophthalmic solution Place 1 drop into both eyes 2 (two) times daily. 07/07/21   [provider]  Multiple Vitamins-Minerals (PRESERVISION  AREDS 2) CAPS Take 1 capsule by mouth 2 (two) times daily. 12/21/17   Nicholas Lose, MD  olmesartan (BENICAR) 20 MG tablet Take 10 mg by mouth daily. 10/27/21   [provider]  Omega-3 1000 MG CAPS Take 1,000 mg by mouth daily.    [provider]  omeprazole (PRILOSEC) 20 MG capsule Take 20 mg by mouth daily as needed (for heartburn or acid reflux). Reported on 07/18/2015    [provider]  protein supplement shake (PREMIER PROTEIN) LIQD Take 2 oz by mouth daily.    [provider]  saccharomyces boulardii (FLORASTOR) 250 MG capsule Take 250 mg by mouth daily.    [provider]  Turmeric (QC TUMERIC COMPLEX) 500 MG CAPS Take 2,000 mg by mouth daily. Patient taking differently: Take 1,000 mg by mouth daily. 12/20/18   Nicholas Lose, MD  Wheat Dextrin (BENEFIBER ON THE GO) PACK Take 1 Package by mouth daily. Added to coffee daily    [provider]      Allergies    Doxycycline, Lisinopril, and Shellfish allergy    Review of Systems   Review of Systems  Physical Exam Updated Vital Signs BP (!) 169/73   Pulse 82   Temp 98.2 F (36.8 C) (Oral)   Resp 10   Wt 81 kg   SpO2 96%   BMI 29.72 kg/m  Physical Exam Vitals and nursing note  reviewed.  Constitutional:      General: She is not in acute distress.    Appearance: She is well-developed.  HENT:     Head: Normocephalic and atraumatic.     Mouth/Throat:     Mouth: Mucous membranes are moist.  Eyes:     General: Vision grossly intact. Gaze aligned appropriately.     Extraocular Movements: Extraocular movements intact.     Conjunctiva/sclera: Conjunctivae normal.  Cardiovascular:     Rate and Rhythm: Normal rate and regular rhythm.     Pulses: Normal pulses.     Heart sounds: Normal heart sounds, S1 normal and S2 normal. No murmur heard.    No friction rub. No gallop.  Pulmonary:     Effort: Pulmonary effort is normal. No respiratory distress.     Breath sounds: Normal breath  sounds.  Abdominal:     General: Bowel sounds are normal.     Palpations: Abdomen is soft.     Tenderness: There is no abdominal tenderness. There is no guarding or rebound.     Hernia: No hernia is present.  Musculoskeletal:        General: No swelling.     Cervical back: Full passive range of motion without pain, normal range of motion and neck supple. No spinous process tenderness or muscular tenderness. Normal range of motion.     Right lower leg: No edema.     Left lower leg: No edema.  Skin:    General: Skin is warm and dry.     Capillary Refill: Capillary refill takes less than 2 seconds.     Findings: No ecchymosis, erythema, rash or wound.  Neurological:     General: No focal deficit present.     Mental Status: She is alert and oriented to person, place, and time.     GCS: GCS eye subscore is 4. GCS verbal subscore is 5. GCS motor subscore is 6.     Cranial Nerves: Cranial nerves 2-12 are intact.     Sensory: Sensation is intact.     Motor: Motor function is intact.     Coordination: Coordination is intact.  Psychiatric:        Attention and Perception: Attention normal.        Mood and Affect: Mood normal.        Speech: Speech normal.        Behavior: Behavior normal.     ED Results / Procedures / Treatments   Labs (all labs ordered are listed, but only abnormal results are displayed) Labs Reviewed  CBC WITH DIFFERENTIAL/PLATELET - Abnormal; Notable for the following components:      Result Value   WBC 11.8 (*)    RBC 5.42 (*)    Hemoglobin 15.1 (*)    HCT 46.3 (*)    Neutro Abs 8.5 (*)    All other components within normal limits  BASIC METABOLIC PANEL - Abnormal; Notable for the following components:   Glucose, Bld 113 (*)    Creatinine, Ser 1.01 (*)    All other components within normal limits  TROPONIN I (HIGH SENSITIVITY)  TROPONIN I (HIGH SENSITIVITY)    EKG EKG Interpretation  Date/Time:  Monday June 01 2022 21:10:41 EST Ventricular Rate:   85 PR Interval:  174 QRS Duration: 87 QT Interval:  395 QTC Calculation: 470 R Axis:   84 Text Interpretation: Sinus rhythm Biatrial enlargement Borderline right axis deviation Minimal ST elevation, inferior leads No significant change since last tracing  Confirmed by Orpah Greek [phone]) on 06/01/2022 11:09:39 PM  Radiology CT Head Wo Contrast  Result Date: 06/01/2022 CLINICAL DATA:  Hypertension, dizziness, headache EXAM: CT HEAD WITHOUT CONTRAST TECHNIQUE: Contiguous axial images were obtained from the base of the skull through the vertex without intravenous contrast. RADIATION DOSE REDUCTION: This exam was performed according to the departmental dose-optimization program which includes automated exposure control, adjustment of the mA and/or kV according to patient size and/or use of iterative reconstruction technique. COMPARISON:  01/06/2011 FINDINGS: Brain: No evidence of acute infarction, hemorrhage, mass, mass effect, or midline shift. No hydrocephalus or extra-axial fluid collection. Periventricular white matter changes, likely the sequela of chronic small vessel ischemic disease. Vascular: No hyperdense vessel. Skull: Negative for fracture or focal lesion. Sinuses/Orbits: No acute finding. Other: The mastoid air cells are well aerated. IMPRESSION: No acute intracranial process. Electronically Signed   By: Merilyn Baba M.D.   On: 06/01/2022 21:55   DG Chest 1 View  Result Date: 06/01/2022 CLINICAL DATA:  Chest pain and elevated blood pressure. EXAM: CHEST  1 VIEW COMPARISON:  None Available. FINDINGS: The heart size and mediastinal contours are within normal limits. Both lungs are clear. Radiopaque surgical clips are seen within the soft tissues along the lateral aspect of the mid and lower left hemithorax. Postoperative changes are seen throughout the mid to lower thoracic spine and upper lumbar spine. IMPRESSION: No active cardiopulmonary disease. Electronically Signed   By:  Virgina Norfolk M.D.   On: 06/01/2022 21:45    Procedures Procedures  {Document cardiac monitor, telemetry assessment procedure when appropriate:1}  Medications Ordered in ED Medications - No data to display  ED Course/ Medical Decision Making/ A&P   {   Click here for ABCD2, HEART and other calculatorsREFRESH Note before signing :1}                          Medical Decision Making  ***  {Document critical care time when appropriate:1} {Document review of labs and clinical decision tools ie heart score, Chads2Vasc2 etc:1}  {Document your independent review of radiology images, and any outside records:1} {Document your discussion with family members, caretakers, and with consultants:1} {Document social determinants of health affecting pt's care:1} {Document your decision making why or why not admission, treatments were needed:1} Final Clinical Impression(s) / ED Diagnoses Final diagnoses:  None    Rx / DC Orders ED Discharge Orders     None

## 2022-06-02 DIAGNOSIS — Z1231 Encounter for screening mammogram for malignant neoplasm of breast: Secondary | ICD-10-CM | POA: Diagnosis not present

## 2022-06-02 DIAGNOSIS — Z124 Encounter for screening for malignant neoplasm of cervix: Secondary | ICD-10-CM | POA: Diagnosis not present

## 2022-06-02 DIAGNOSIS — Z1272 Encounter for screening for malignant neoplasm of vagina: Secondary | ICD-10-CM | POA: Diagnosis not present

## 2022-06-02 DIAGNOSIS — Z6829 Body mass index (BMI) 29.0-29.9, adult: Secondary | ICD-10-CM | POA: Diagnosis not present

## 2022-06-02 LAB — TROPONIN I (HIGH SENSITIVITY): Troponin I (High Sensitivity): 5 ng/L (ref <18)

## 2022-06-02 MED ORDER — OLMESARTAN MEDOXOMIL 20 MG PO TABS: 20.0000 mg | ORAL_TABLET | Freq: Every day | ORAL | 0 refills | Status: DC

## 2022-06-03 DIAGNOSIS — R7303 Prediabetes: Secondary | ICD-10-CM | POA: Diagnosis not present

## 2022-06-03 DIAGNOSIS — M62830 Muscle spasm of back: Secondary | ICD-10-CM | POA: Diagnosis not present

## 2022-06-03 DIAGNOSIS — R9431 Abnormal electrocardiogram [ECG] [EKG]: Secondary | ICD-10-CM | POA: Diagnosis not present

## 2022-06-03 DIAGNOSIS — R7989 Other specified abnormal findings of blood chemistry: Secondary | ICD-10-CM | POA: Diagnosis not present

## 2022-06-03 DIAGNOSIS — I10 Essential (primary) hypertension: Secondary | ICD-10-CM | POA: Diagnosis not present

## 2022-06-03 DIAGNOSIS — R799 Abnormal finding of blood chemistry, unspecified: Secondary | ICD-10-CM | POA: Diagnosis not present

## 2022-06-06 ENCOUNTER — Emergency Department (HOSPITAL_COMMUNITY)
Admission: EM | Admit: 2022-06-06 | Discharge: 2022-06-06 | Disposition: A | Discharge status: Home / Self Care | Payer: Medicare PPO | Attending: Emergency Medicine | Admitting: Emergency Medicine

## 2022-06-06 ENCOUNTER — Other Ambulatory Visit: Payer: Self-pay

## 2022-06-06 ENCOUNTER — Encounter (HOSPITAL_COMMUNITY): Payer: Self-pay

## 2022-06-06 DIAGNOSIS — Z79899 Other long term (current) drug therapy: Secondary | ICD-10-CM | POA: Diagnosis not present

## 2022-06-06 DIAGNOSIS — I10 Essential (primary) hypertension: Secondary | ICD-10-CM | POA: Diagnosis not present

## 2022-06-06 LAB — CBC WITH DIFFERENTIAL/PLATELET
Abs Immature Granulocytes: 0.02 10*3/uL (ref 0.00–0.07)
Basophils Absolute: 0.1 10*3/uL (ref 0.0–0.1)
Basophils Relative: 1 %
Eosinophils Absolute: 0.2 10*3/uL (ref 0.0–0.5)
Eosinophils Relative: 2 %
HCT: 47.6 % — ABNORMAL HIGH (ref 36.0–46.0)
Hemoglobin: 15.2 g/dL — ABNORMAL HIGH (ref 12.0–15.0)
Immature Granulocytes: 0 %
Lymphocytes Relative: 30 %
Lymphs Abs: 2.8 10*3/uL (ref 0.7–4.0)
MCH: 27.3 pg (ref 26.0–34.0)
MCHC: 31.9 g/dL (ref 30.0–36.0)
MCV: 85.6 fL (ref 80.0–100.0)
Monocytes Absolute: 0.6 10*3/uL (ref 0.1–1.0)
Monocytes Relative: 6 %
Neutro Abs: 5.8 10*3/uL (ref 1.7–7.7)
Neutrophils Relative %: 61 %
Platelets: 260 10*3/uL (ref 150–400)
RBC: 5.56 MIL/uL — ABNORMAL HIGH (ref 3.87–5.11)
RDW: 14.1 % (ref 11.5–15.5)
WBC: 9.5 10*3/uL (ref 4.0–10.5)
nRBC: 0 % (ref 0.0–0.2)

## 2022-06-06 LAB — URINALYSIS, ROUTINE W REFLEX MICROSCOPIC
Bilirubin Urine: NEGATIVE
Glucose, UA: NEGATIVE mg/dL
Hgb urine dipstick: NEGATIVE
Ketones, ur: NEGATIVE mg/dL
Nitrite: NEGATIVE
Protein, ur: NEGATIVE mg/dL
Specific Gravity, Urine: 1.004 — ABNORMAL LOW (ref 1.005–1.030)
pH: 6 (ref 5.0–8.0)

## 2022-06-06 LAB — BASIC METABOLIC PANEL
Anion gap: 12 (ref 5–15)
BUN: 22 mg/dL (ref 8–23)
CO2: 23 mmol/L (ref 22–32)
Calcium: 10 mg/dL (ref 8.9–10.3)
Chloride: 95 mmol/L — ABNORMAL LOW (ref 98–111)
Creatinine, Ser: 1.1 mg/dL — ABNORMAL HIGH (ref 0.44–1.00)
GFR, Estimated: 54 mL/min — ABNORMAL LOW (ref >60)
Glucose, Bld: 89 mg/dL (ref 70–99)
Potassium: 3.7 mmol/L (ref 3.5–5.1)
Sodium: 130 mmol/L — ABNORMAL LOW (ref 135–145)

## 2022-06-06 NOTE — ED Provider Notes (Signed)
[place] EMERGENCY DEPARTMENT AT [facility] Provider Note   CSN: [id number] Arrival date & time: 06/06/22  1843     History Chief Complaint  Patient presents with   Hypertension    Karen Roth is a 72 y.o. female.  Patient presents emergency department for asymptomatic hypertension.  Patient was seen in the emergency department a few days ago for similar concerns and had extensive workup performed at that time without any acute abnormality seen.  At that time patient had an increase in the olmesartan from 10 mg to 20 mg.  Patient was seen by her primary care provider 3 days ago with no changes made to blood pressure medication since the adjustments made at the last ED visit.  Patient denies chest pain, shortness of breath, headaches, blurry vision.   Hypertension Pertinent negatives include no chest pain, no abdominal pain, no headaches and no shortness of breath.      Home Medications Prior to Admission medications   Medication Sig Start Date End Date Taking? Authorizing Provider  acetaminophen (TYLENOL) 500 MG tablet Take 500-1,000 mg by mouth daily as needed for mild pain or headache. Reported on 08/20/2015    [provider]  ALPRAZolam Duanne Moron) 0.5 MG tablet Take 0.5 mg by mouth 3 (three) times daily as needed for anxiety.     [provider]  amLODipine (NORVASC) 5 MG tablet Take 5 mg by mouth daily.     [provider]  APPLE CIDER VINEGAR PO Take 15 mLs by mouth 3 (three) times a week.    [provider]  azelastine (OPTIVAR) 0.05 % ophthalmic solution Apply 1 drop to eye 2 (two) times daily. 06/13/21   [provider]  Calcium Citrate-Vitamin D (CITRACAL + D PO) Take 1 tablet by mouth daily.     [provider]  Cholecalciferol (VITAMIN D3) 2000 units capsule Take 2,000 Units by mouth daily.     [provider]  dorzolamide-timolol (COSOPT) 22.3-6.8 MG/ML ophthalmic solution Place 1 drop into  both eyes 2 (two) times daily. 07/07/21   [provider]  Multiple Vitamins-Minerals (PRESERVISION AREDS 2) CAPS Take 1 capsule by mouth 2 (two) times daily. 12/21/17   Nicholas Lose, MD  olmesartan (BENICAR) 20 MG tablet Take 1 tablet (20 mg total) by mouth daily. 06/02/22   Orpah Greek, MD  Omega-3 1000 MG CAPS Take 1,000 mg by mouth daily.    [provider]  omeprazole (PRILOSEC) 20 MG capsule Take 20 mg by mouth daily as needed (for heartburn or acid reflux). Reported on 07/18/2015    [provider]  protein supplement shake (PREMIER PROTEIN) LIQD Take 2 oz by mouth daily.    [provider]  saccharomyces boulardii (FLORASTOR) 250 MG capsule Take 250 mg by mouth daily.    [provider]  Turmeric (QC TUMERIC COMPLEX) 500 MG CAPS Take 2,000 mg by mouth daily. Patient taking differently: Take 1,000 mg by mouth daily. 12/20/18   Nicholas Lose, MD  Wheat Dextrin (BENEFIBER ON THE GO) PACK Take 1 Package by mouth daily. Added to coffee daily    [provider]      Allergies    Doxycycline, Lisinopril, and Shellfish allergy    Review of Systems   Review of Systems  Constitutional:  Negative for fever.  Eyes:  Negative for visual disturbance.  Respiratory:  Negative for choking, chest tightness and shortness of breath.   Cardiovascular:  Negative for chest pain.  Gastrointestinal:  Negative for abdominal pain.  Genitourinary:  Negative for dysuria.  Neurological:  Negative for weakness and headaches.  All other systems reviewed and are negative.   Physical Exam Updated Vital Signs BP (!) 162/88 (BP Location: Right Arm)   Pulse 94   Temp 98.2 F (36.8 C) (Oral)   Resp 18   Ht 5' 5"$  (1.651 m)   Wt 81 kg   SpO2 96%   BMI 29.72 kg/m  Physical Exam Vitals and nursing note reviewed.  Constitutional:      Appearance: Normal appearance.     Comments: Patient in no distress but appears anxious about blood pressure  HENT:      Head: Normocephalic and atraumatic.  Cardiovascular:     Rate and Rhythm: Normal rate and regular rhythm.     Pulses: Normal pulses.     Heart sounds: Normal heart sounds.  Pulmonary:     Effort: Pulmonary effort is normal.     Breath sounds: Normal breath sounds.  Skin:    General: Skin is warm and dry.     Capillary Refill: Capillary refill takes less than 2 seconds.  Neurological:     General: No focal deficit present.     Mental Status: She is alert.     ED Results / Procedures / Treatments   Labs (all labs ordered are listed, but only abnormal results are displayed) Labs Reviewed  BASIC METABOLIC PANEL - Abnormal; Notable for the following components:      Result Value   Sodium 130 (*)    Chloride 95 (*)    Creatinine, Ser 1.10 (*)    GFR, Estimated 54 (*)    All other components within normal limits  CBC WITH DIFFERENTIAL/PLATELET - Abnormal; Notable for the following components:   RBC 5.56 (*)    Hemoglobin 15.2 (*)    HCT 47.6 (*)    All other components within normal limits  URINALYSIS, ROUTINE W REFLEX MICROSCOPIC - Abnormal; Notable for the following components:   Color, Urine STRAW (*)    Specific Gravity, Urine 1.004 (*)    Leukocytes,Ua MODERATE (*)    Bacteria, UA RARE (*)    All other components within normal limits    EKG EKG Interpretation  Date/Time:  Saturday June 06 2022 20:14:16 EST Ventricular Rate:  80 PR Interval:  180 QRS Duration: 86 QT Interval:  350 QTC Calculation: 403 R Axis:   75 Text Interpretation: Normal sinus rhythm with sinus arrhythmia Right atrial enlargement Nonspecific ST abnormality Abnormal ECG When compared with ECG of 01-Jun-2022 21:10, ST segment changes more pronounced inferiorly No STEMI Confirmed by Regan Lemming ([id number]) on 06/06/2022 8:19:33 PM  Radiology No results found.  Procedures Procedures   Medications Ordered in ED Medications - No data to display  ED Course/ Medical Decision Making/ A&P                            Medical Decision Making  This patient presents to the ED for concern of hypertension.  Differential diagnosis includes asymptomatic hypertension, chronic pain, viral URI, UTI, renal artery stenosis   Lab Tests:  I Ordered, and personally interpreted labs.  The pertinent results include:  Normal CBC, UA indicating possible UTI, pending BMP   EKG Interpretation:  Sinus rhythm with sinus arrhythmia  Medicines ordered and prescription drug management:  I have reviewed the patients home medicines and have made adjustments as needed   Problem  List / ED Course:  Patient presented to the ED for hypertension. She was seen a few days ago in the ED for similar complaints. At that time, dose of Olmasartan was increased from 62m to 254mand patient was seen by PCP with no adjustments made to medications at that time. Patient is asymptomatic at this time with no chest pain, shortness of breath, or vision changes. Initially concerned as BP at home was elevated with systolic >[id number] patient has remained stable in the ED. Patient's lab remain stable from last check in the ED a few days ago. Advised patient of results and encouraged patient to follow up with PCP and nephrology for further evaluation. Patient agreeable with treatment plan and discharge home. All questions answered prior to discharge. No adjustments to blood pressure medications at this time.  Final Clinical Impression(s) / ED Diagnoses Final diagnoses:  Hypertension, unspecified type    Rx / DC Orders ED Discharge Orders     None         ZeVladimir Creeks2/17/24 2224    LaRegan LemmingMD 06/06/22 2325

## 2022-06-06 NOTE — ED Provider Triage Note (Signed)
Emergency Medicine Provider Triage Evaluation Note  Karen Roth , a 72 y.o. female  was evaluated in triage.  Pt complains of hypertension.  Patient with history of chronic back pain, with worsening pain over the past week.  Patient was evaluated on Monday at the emergency department was found to be hypertensive.  She states that since that time she has been monitoring her blood pressure multiple times throughout the day.  She states that her blood pressure started off in the normal range to begin the day today but throughout the day she has checked it multiple times and has been fluctuating.  Her highest reading has been somewhere in the realm of 210/90-100.  She denies any change in her back pain but states that primary care prescribed her a muscle relaxant.  She was also prescribed a new blood pressure medication on her emergency department visit on Monday.  She denies chest pain, nausea, headache, abdominal pain  Review of Systems  Positive: As above Negative: As above  Physical Exam  BP (!) 168/91   Pulse 100   Temp 98.3 F (36.8 C)   Resp 18   Ht 5' 5"$  (1.651 m)   Wt 81 kg   SpO2 97%   BMI 29.72 kg/m  Gen:   Awake, no distress   Resp:  Normal effort  MSK:   Moves extremities without difficulty  Other:    Medical Decision Making  Medically screening exam initiated at 7:02 PM.  Appropriate orders placed.  Yuli Tuleen Brodowski was informed that the remainder of the evaluation will be completed by another provider, this initial triage assessment does not replace that evaluation, and the importance of remaining in the ED until their evaluation is complete.     Ronny Bacon 06/06/22 1904

## 2022-06-06 NOTE — ED Triage Notes (Signed)
States that she is having chronic lower back pain, and that could be adding to the BP

## 2022-06-06 NOTE — Discharge Instructions (Signed)
You were seen in the ER for hypertension. Thankfully your blood pressure was stable in the ER. Given that your labs were largely unchanged from a few days ago, no changes have been made to your blood pressure regimen. Please follow up with your primary care provider and nephrologist for further evaluation as needed for blood pressure.

## 2022-06-06 NOTE — ED Triage Notes (Signed)
Patient was just here on Monday for the same complaint, states that he BP have still been elevated. States that the highest it got was 210/94

## 2022-06-10 DIAGNOSIS — R1032 Left lower quadrant pain: Secondary | ICD-10-CM | POA: Diagnosis not present

## 2022-06-11 DIAGNOSIS — M549 Dorsalgia, unspecified: Secondary | ICD-10-CM | POA: Diagnosis not present

## 2022-06-11 DIAGNOSIS — I10 Essential (primary) hypertension: Secondary | ICD-10-CM | POA: Diagnosis not present

## 2022-06-11 DIAGNOSIS — R8290 Unspecified abnormal findings in urine: Secondary | ICD-10-CM | POA: Diagnosis not present

## 2022-06-11 DIAGNOSIS — R9431 Abnormal electrocardiogram [ECG] [EKG]: Secondary | ICD-10-CM | POA: Diagnosis not present

## 2022-06-15 DIAGNOSIS — N182 Chronic kidney disease, stage 2 (mild): Secondary | ICD-10-CM | POA: Diagnosis not present

## 2022-06-17 DIAGNOSIS — S39012A Strain of muscle, fascia and tendon of lower back, initial encounter: Secondary | ICD-10-CM | POA: Diagnosis not present

## 2022-06-17 DIAGNOSIS — M546 Pain in thoracic spine: Secondary | ICD-10-CM | POA: Diagnosis not present

## 2022-06-24 DIAGNOSIS — S39012D Strain of muscle, fascia and tendon of lower back, subsequent encounter: Secondary | ICD-10-CM | POA: Diagnosis not present

## 2022-06-24 DIAGNOSIS — R531 Weakness: Secondary | ICD-10-CM | POA: Diagnosis not present

## 2022-06-24 DIAGNOSIS — M4326 Fusion of spine, lumbar region: Secondary | ICD-10-CM | POA: Diagnosis not present

## 2022-06-25 DIAGNOSIS — N182 Chronic kidney disease, stage 2 (mild): Secondary | ICD-10-CM | POA: Diagnosis not present

## 2022-06-25 DIAGNOSIS — I129 Hypertensive chronic kidney disease with stage 1 through stage 4 chronic kidney disease, or unspecified chronic kidney disease: Secondary | ICD-10-CM | POA: Diagnosis not present

## 2022-06-25 DIAGNOSIS — Z9889 Other specified postprocedural states: Secondary | ICD-10-CM | POA: Diagnosis not present

## 2022-06-25 DIAGNOSIS — K922 Gastrointestinal hemorrhage, unspecified: Secondary | ICD-10-CM | POA: Diagnosis not present

## 2022-07-02 DIAGNOSIS — R531 Weakness: Secondary | ICD-10-CM | POA: Diagnosis not present

## 2022-07-02 DIAGNOSIS — S39012D Strain of muscle, fascia and tendon of lower back, subsequent encounter: Secondary | ICD-10-CM | POA: Diagnosis not present

## 2022-07-02 DIAGNOSIS — M4326 Fusion of spine, lumbar region: Secondary | ICD-10-CM | POA: Diagnosis not present

## 2022-07-07 DIAGNOSIS — M4326 Fusion of spine, lumbar region: Secondary | ICD-10-CM | POA: Diagnosis not present

## 2022-07-07 DIAGNOSIS — R531 Weakness: Secondary | ICD-10-CM | POA: Diagnosis not present

## 2022-07-07 DIAGNOSIS — S39012D Strain of muscle, fascia and tendon of lower back, subsequent encounter: Secondary | ICD-10-CM | POA: Diagnosis not present

## 2022-07-09 DIAGNOSIS — N182 Chronic kidney disease, stage 2 (mild): Secondary | ICD-10-CM | POA: Diagnosis not present

## 2022-07-09 DIAGNOSIS — I129 Hypertensive chronic kidney disease with stage 1 through stage 4 chronic kidney disease, or unspecified chronic kidney disease: Secondary | ICD-10-CM | POA: Diagnosis not present

## 2022-07-10 DIAGNOSIS — S39012D Strain of muscle, fascia and tendon of lower back, subsequent encounter: Secondary | ICD-10-CM | POA: Diagnosis not present

## 2022-07-10 DIAGNOSIS — R531 Weakness: Secondary | ICD-10-CM | POA: Diagnosis not present

## 2022-07-10 DIAGNOSIS — M4326 Fusion of spine, lumbar region: Secondary | ICD-10-CM | POA: Diagnosis not present

## 2022-07-14 ENCOUNTER — Ambulatory Visit: Payer: Medicare PPO | Admitting: Interventional Cardiology

## 2022-07-15 DIAGNOSIS — S39012D Strain of muscle, fascia and tendon of lower back, subsequent encounter: Secondary | ICD-10-CM | POA: Diagnosis not present

## 2022-07-15 DIAGNOSIS — M4326 Fusion of spine, lumbar region: Secondary | ICD-10-CM | POA: Diagnosis not present

## 2022-07-15 DIAGNOSIS — R531 Weakness: Secondary | ICD-10-CM | POA: Diagnosis not present

## 2022-07-21 DIAGNOSIS — M436 Torticollis: Secondary | ICD-10-CM | POA: Diagnosis not present

## 2022-07-21 DIAGNOSIS — R531 Weakness: Secondary | ICD-10-CM | POA: Diagnosis not present

## 2022-07-21 DIAGNOSIS — S39012D Strain of muscle, fascia and tendon of lower back, subsequent encounter: Secondary | ICD-10-CM | POA: Diagnosis not present

## 2022-07-23 DIAGNOSIS — M4326 Fusion of spine, lumbar region: Secondary | ICD-10-CM | POA: Diagnosis not present

## 2022-07-23 DIAGNOSIS — R531 Weakness: Secondary | ICD-10-CM | POA: Diagnosis not present

## 2022-07-23 DIAGNOSIS — S39012D Strain of muscle, fascia and tendon of lower back, subsequent encounter: Secondary | ICD-10-CM | POA: Diagnosis not present

## 2022-08-10 DIAGNOSIS — H43811 Vitreous degeneration, right eye: Secondary | ICD-10-CM | POA: Diagnosis not present

## 2022-08-10 DIAGNOSIS — H0102B Squamous blepharitis left eye, upper and lower eyelids: Secondary | ICD-10-CM | POA: Diagnosis not present

## 2022-08-10 DIAGNOSIS — Z961 Presence of intraocular lens: Secondary | ICD-10-CM | POA: Diagnosis not present

## 2022-08-10 DIAGNOSIS — H353132 Nonexudative age-related macular degeneration, bilateral, intermediate dry stage: Secondary | ICD-10-CM | POA: Diagnosis not present

## 2022-08-10 DIAGNOSIS — H18513 Endothelial corneal dystrophy, bilateral: Secondary | ICD-10-CM | POA: Diagnosis not present

## 2022-08-10 DIAGNOSIS — H401231 Low-tension glaucoma, bilateral, mild stage: Secondary | ICD-10-CM | POA: Diagnosis not present

## 2022-08-10 DIAGNOSIS — H35033 Hypertensive retinopathy, bilateral: Secondary | ICD-10-CM | POA: Diagnosis not present

## 2022-08-10 DIAGNOSIS — H0102A Squamous blepharitis right eye, upper and lower eyelids: Secondary | ICD-10-CM | POA: Diagnosis not present

## 2022-08-31 ENCOUNTER — Encounter: Payer: Self-pay | Admitting: Interventional Cardiology

## 2022-08-31 ENCOUNTER — Ambulatory Visit: Payer: Medicare PPO | Attending: Interventional Cardiology | Admitting: Interventional Cardiology

## 2022-08-31 VITALS — BP 158/80 | HR 67 | Ht 66.0 in | Wt 179.6 lb

## 2022-08-31 DIAGNOSIS — R7303 Prediabetes: Secondary | ICD-10-CM

## 2022-08-31 DIAGNOSIS — I10 Essential (primary) hypertension: Secondary | ICD-10-CM | POA: Diagnosis not present

## 2022-08-31 DIAGNOSIS — R9431 Abnormal electrocardiogram [ECG] [EKG]: Secondary | ICD-10-CM

## 2022-08-31 MED ORDER — AMLODIPINE BESYLATE 10 MG PO TABS: 10.0000 mg | ORAL_TABLET | Freq: Every day | ORAL | 3 refills | Status: DC

## 2022-08-31 MED ORDER — OLMESARTAN MEDOXOMIL 20 MG PO TABS: 20.0000 mg | ORAL_TABLET | Freq: Every day | ORAL | 3 refills | Status: DC

## 2022-08-31 NOTE — Progress Notes (Signed)
Cardiology Office Note   Date:  08/31/2022   ID:  Karen Roth, DOB Jan 24, 1951, MRN [id number]  PCP:  Karen Salines, MD (Inactive)    No chief complaint on file.  Abnormal ECG  Wt Readings from Last 3 Encounters:  08/31/22 179 lb 9.6 oz (81.5 kg)  06/06/22 178 lb 9.2 oz (81 kg)  06/01/22 178 lb 9.2 oz (81 kg)       History of Present Illness: Karen Roth is a 72 y.o. female who is being seen today for the evaluation of abnormal ECG at the request of Karen Roth, Karen Lerner, MD.   Her brother is Karen Roth who is also my patient.   She has ben on high BP meds for several years.  She had some kidney issue.  She has ben followed by [facility].  She has been on lisinopril/ HCTZ but had side effects- itching and hives.  SHe was then changed to amlodipine.  Ultimately olmesartan was started.  Two trips to ER in 05/2022 due to high BP.  Was having some back pain.    Denies : exertional Chest pain. Dizziness. Leg edema. Nitroglycerin use. Orthopnea. Palpitations. Paroxysmal nocturnal dyspnea. Syncope.     Past Medical History:  Diagnosis Date   Anxiety    takes Xanax daily as needed   Arthritis    Breast cancer (HCC)    Diverticulosis    GERD (gastroesophageal reflux disease)    takes Omeprazole daily as needed   Headache    more so since diagnosis of breast cancer   History of bronchitis > 20 yrs ago   History of colon polyps    benign   History of renal failure 2013   saw a kidney specialist and was released a yr ago.Thought it was back related   Hypertension    takes Amlodipine daily   Neck pain    and right shoulder.Arthritis and Bone Spurs   Osteoarthritis    Personal history of radiation therapy    2017   Urinary frequency     Past Surgical History:  Procedure Laterality Date   ABDOMINAL HYSTERECTOMY     APPENDECTOMY     BACK SURGERY  1971   Harrington Rod Placement   BACK SURGERY     BREAST LUMPECTOMY Right 06/17/2015    Malignant   BREAST LUMPECTOMY WITH RADIOACTIVE SEED AND SENTINEL LYMPH NODE BIOPSY Right 06/17/2015   Procedure: BREAST LUMPECTOMY WITH RADIOACTIVE SEED AND SENTINEL LYMPH NODE BIOPSY;  Surgeon: Chevis Pretty III, MD;  Location: [facility] OR;  Service: General;  Laterality: Right;   colonoscopy     TONSILLECTOMY       Current Outpatient Medications  Medication Sig Dispense Refill   acetaminophen (TYLENOL) 500 MG tablet Take 500-1,000 mg by mouth daily as needed for mild pain or headache. Reported on 08/20/2015     ALPRAZolam (XANAX) 0.5 MG tablet Take 0.5 mg by mouth 3 (three) times daily as needed for anxiety.      amLODipine (NORVASC) 5 MG tablet Take 5 mg by mouth daily.     APPLE CIDER VINEGAR PO Take 15 mLs by mouth 3 (three) times a week.     azelastine (OPTIVAR) 0.05 % ophthalmic solution Apply 1 drop to eye 2 (two) times daily.     Calcium Citrate-Vitamin D (CITRACAL + D PO) Take 1 tablet by mouth daily.      Cholecalciferol (VITAMIN D3) 2000 units capsule Take 2,000 Units by mouth daily.  dorzolamide-timolol (COSOPT) 22.3-6.8 MG/ML ophthalmic solution Place 1 drop into both eyes 2 (two) times daily.     methocarbamol (ROBAXIN) 750 MG tablet 1 tablet Orally in the evening for 30 days As needed muscle spasm     Multiple Vitamins-Minerals (PRESERVISION AREDS 2) CAPS Take 1 capsule by mouth 2 (two) times daily.     olmesartan (BENICAR) 20 MG tablet Take 1 tablet (20 mg total) by mouth daily. 30 tablet 0   Omega-3 1000 MG CAPS Take 1,000 mg by mouth daily.     omeprazole (PRILOSEC) 20 MG capsule Take 20 mg by mouth daily as needed (for heartburn or acid reflux). Reported on 07/18/2015     protein supplement shake (PREMIER PROTEIN) LIQD Take 2 oz by mouth daily.     saccharomyces boulardii (FLORASTOR) 250 MG capsule Take 250 mg by mouth daily.     Turmeric (QC TUMERIC COMPLEX) 500 MG CAPS Take 2,000 mg by mouth daily. (Patient taking differently: Take 1,000 mg by mouth daily.)     Wheat Dextrin  (BENEFIBER ON THE GO) PACK Take 1 Package by mouth daily. Added to coffee daily     Olmesartan-amLODIPine-HCTZ 20-5-12.5 MG TABS Take 1 tablet by mouth daily. (Patient not taking: Reported on 08/31/2022)     No current facility-administered medications for this visit.    Allergies:   Doxycycline, Lisinopril, and Shellfish allergy    Social History:  The patient  reports that she has been smoking cigarettes. She has never used smokeless tobacco. She reports current alcohol use. She reports that she does not use drugs.   Family History:  The patient's family history includes Breast cancer in her cousin; Cancer in her maternal aunt.    ROS:  Please see the history of present illness.   Otherwise, review of systems are positive for back pain.   All other systems are reviewed and negative.    PHYSICAL EXAM: VS:  BP (!) 158/80   Pulse 67   Ht 5\' 6"  (1.676 m)   Wt 179 lb 9.6 oz (81.5 kg)   SpO2 98%   BMI 28.99 kg/m  , BMI Body mass index is 28.99 kg/m. GEN: Well nourished, well developed, in no acute distress HEENT: normal Neck: no JVD, carotid bruits, or masses Cardiac: RRR; no murmurs, rubs, or gallops,no edema  Respiratory:  clear to auscultation bilaterally, normal work of breathing GI: soft, nontender, nondistended, + BS MS: no deformity or atrophy Skin: warm and dry, no rash Neuro:  Strength and sensation are intact Psych: euthymic mood, full affect   EKG:   The ekg ordered today demonstrates NSR, biatrial enlargement, nonspecific ST changes   Recent Labs: 06/06/2022: BUN 22; Creatinine, Ser 1.10; Hemoglobin 15.2; Platelets 260; Potassium 3.7; Sodium 130   Lipid Panel No results found for: "CHOL", "TRIG", "HDL", "CHOLHDL", "VLDL", "LDLCALC", "LDLDIRECT"   Other studies Reviewed: Additional studies/ records that were reviewed today with results demonstrating: labs reviewed.  August 2022 LDL 106 HDL 78 total cholesterol 192 triglycerides 87   ASSESSMENT AND  PLAN:  Abnormal ECG: atrial enlargement on ECG likely related to HTN. Check echocardiogram.  No evidence of CHF on exam.  PreDM: Whole food, plant based diet.  High fiber diet.  Avoid processed foods.  HTN: Low salt diet.  Starting to exercise more.  Prefers to have separate meds rather than the combination pill.  COntinue olmesartan 20 mg daily.  Increase amlodipine to 10 mg daily. Monitor BP at home.   Calcium scoring CT  scan, given family history of heart disease. THis will help tailor preventive therapy.    Current medicines are reviewed at length with the patient today.  The patient concerns regarding her medicines were addressed.  The following changes have been made:  increase amlodipine  Labs/ tests ordered today include:  No orders of the defined types were placed in this encounter.   Recommend 150 minutes/week of aerobic exercise Low fat, low carb, high fiber diet recommended  Disposition:   FU in pharm HTN clinic in 4-6 weeks, bring BP cuff; f/u with me based on Calcium score CT and echo   Signed, Lance Muss, MD  08/31/2022 11:29 AM    [facility] [address] [place], [place], [place]  [address] Phone: [phone]; Fax: [phone]

## 2022-08-31 NOTE — Patient Instructions (Addendum)
Medication Instructions:  Your physician has recommended you make the following change in your medication:  Increase amlodipine to 10 mg by mouth daily    *If you need a refill on your cardiac medications before your next appointment, please call your pharmacy*   Lab Work: none If you have labs (blood work) drawn today and your tests are completely normal, you will receive your results only by: MyChart Message (if you have MyChart) OR A paper copy in the mail If you have any lab test that is abnormal or we need to change your treatment, we will call you to review the results.   Testing/Procedures: Your physician has requested that you have an echocardiogram. Echocardiography is a painless test that uses sound waves to create images of your heart. It provides your doctor with information about the size and shape of your heart and how well your heart's chambers and valves are working. This procedure takes approximately one hour. There are no restrictions for this procedure. Please do NOT wear cologne, perfume, aftershave, or lotions (deodorant is allowed). Please arrive 15 minutes prior to your appointment time.  Dr Eldridge Dace recommends you have a Calcium Score CT Scan   Follow-Up: At [facility], you and your health needs are our priority.  As part of our continuing mission to provide you with exceptional heart care, we have created designated Provider Care Teams.  These Care Teams include your primary Cardiologist (physician) and Advanced Practice Providers (APPs -  Physician Assistants and Nurse Practitioners) who all work together to provide you with the care you need, when you need it.  We recommend signing up for the patient portal called "MyChart".  Sign up information is provided on this After Visit Summary.  MyChart is used to connect with patients for Virtual Visits (Telemedicine).  Patients are able to view lab/test results, encounter notes, upcoming appointments, etc.   Non-urgent messages can be sent to your provider as well.   To learn more about what you can do with MyChart, go to [email or web address].    Your next appointment:   Based on pharmacist appointment  Provider:   Lance Muss, MD     Other Instructions You have been referred to the hypertension clinic in our office.  Please schedule new patient appointment

## 2022-09-15 DIAGNOSIS — K921 Melena: Secondary | ICD-10-CM | POA: Diagnosis not present

## 2022-09-15 DIAGNOSIS — R1032 Left lower quadrant pain: Secondary | ICD-10-CM | POA: Diagnosis not present

## 2022-09-17 ENCOUNTER — Ambulatory Visit (HOSPITAL_BASED_OUTPATIENT_CLINIC_OR_DEPARTMENT_OTHER)
Admission: RE | Admit: 2022-09-17 | Discharge: 2022-09-17 | Disposition: A | Discharge status: Home / Self Care | Payer: Self-pay | Source: Ambulatory Visit | Attending: Interventional Cardiology | Admitting: Interventional Cardiology

## 2022-09-17 DIAGNOSIS — I10 Essential (primary) hypertension: Secondary | ICD-10-CM | POA: Insufficient documentation

## 2022-09-18 ENCOUNTER — Telehealth: Payer: Self-pay | Admitting: *Deleted

## 2022-09-18 DIAGNOSIS — E785 Hyperlipidemia, unspecified: Secondary | ICD-10-CM

## 2022-09-18 MED ORDER — ROSUVASTATIN CALCIUM 10 MG PO TABS: 10.0000 mg | ORAL_TABLET | Freq: Every day | ORAL | 11 refills | Status: DC

## 2022-09-18 NOTE — Telephone Encounter (Signed)
Left message to call office

## 2022-09-18 NOTE — Addendum Note (Signed)
Addended by: Dossie Arbour on: 09/18/2022 01:08 PM   Modules accepted: Orders

## 2022-09-18 NOTE — Telephone Encounter (Signed)
-----   Message from Corky Crafts, MD sent at 09/17/2022 10:22 PM EDT ----- Calcium score relatively low, but not 0.  Given family h/o CAD, would consider low dose statin to get LDL closer to 70.  Rosuvastatin 10 mg daily with liver nad lipids in 3 months.  1 year f/u.

## 2022-09-18 NOTE — Telephone Encounter (Signed)
Patient reports she has not had lipids checked recently.  She would like to start Rosuvastatin. Prescription sent to [facility] on [facility] [address].  Patient will come in for fasting lab work on December 15, 2022.

## 2022-09-18 NOTE — Telephone Encounter (Signed)
Patient notified.  She is hesitant to start statin and would like to see if she has had recent lab work before starting.  She thinks she has had lipids checked since August 2022 and will contact PCP to see if labs were done.  If labs done patient will bring to upcoming hypertension clinic appointment and discuss with pharmacist at this visit.  If labs available before this appointment or not done recently she will call office to discuss.

## 2022-09-25 NOTE — Progress Notes (Unsigned)
Patient ID: Karen Roth                 DOB: [birth year]                      MRN: [id number]      HPI: Karen Roth is a 72 y.o. female referred by Dr. Eldridge Dace to HTN clinic. PMH is significant for breast cancer, HTN stage 3b CKD (last eGFR 54 on 06/06/2022)(CrCl 61 mL/min). She has ben on high BP meds for several years.  She had some kidney issue.  She has ben followed by [facility].  She has been on lisinopril/ HCTZ but had side effects- itching and hives.  She was then changed to amlodipine. Ultimately olmesartan was started.  At the last visit with Dr. Eldridge Dace BP was 158/80 so amlodipine dose was increased to 10 mg.   Current HTN meds: Amlodipine 10 mg daily, olmesartan 20 mg daily  Previously tried: lisinopril HCTZ hives, itching  BP goal: <130/80  Family History:   Social History:   Diet:   Exercise:  {types:28256}  Home BP readings:  Date SBP/DBP  HR              Average      Wt Readings from Last 3 Encounters:  08/31/22 179 lb 9.6 oz (81.5 kg)  06/06/22 178 lb 9.2 oz (81 kg)  06/01/22 178 lb 9.2 oz (81 kg)   BP Readings from Last 3 Encounters:  08/31/22 (!) 158/80  06/06/22 (!) 162/88  06/02/22 (!) 148/72   Pulse Readings from Last 3 Encounters:  08/31/22 67  06/06/22 94  06/02/22 83    Renal function: CrCl cannot be calculated (Patient's most recent lab result is older than the maximum 21 days allowed.).  Past Medical History:  Diagnosis Date   Anxiety    takes Xanax daily as needed   Arthritis    Breast cancer (HCC)    Diverticulosis    GERD (gastroesophageal reflux disease)    takes Omeprazole daily as needed   Headache    more so since diagnosis of breast cancer   History of bronchitis > 20 yrs ago   History of colon polyps    benign   History of renal failure 2013   saw a kidney specialist and was released a yr ago.Thought it was back related   Hypertension    takes Amlodipine daily   Neck pain    and right  shoulder.Arthritis and Bone Spurs   Osteoarthritis    Personal history of radiation therapy    2017   Urinary frequency     Current Outpatient Medications on File Prior to Visit  Medication Sig Dispense Refill   acetaminophen (TYLENOL) 500 MG tablet Take 500-1,000 mg by mouth daily as needed for mild pain or headache. Reported on 08/20/2015     ALPRAZolam (XANAX) 0.5 MG tablet Take 0.5 mg by mouth 3 (three) times daily as needed for anxiety.      amLODipine (NORVASC) 10 MG tablet Take 1 tablet (10 mg total) by mouth daily. 90 tablet 3   APPLE CIDER VINEGAR PO Take 15 mLs by mouth 3 (three) times a week.     azelastine (OPTIVAR) 0.05 % ophthalmic solution Apply 1 drop to eye 2 (two) times daily.     Calcium Citrate-Vitamin D (CITRACAL + D PO) Take 1 tablet by mouth daily.      Cholecalciferol (VITAMIN D3) 2000 units capsule Take 2,000  Units by mouth daily.      dorzolamide-timolol (COSOPT) 22.3-6.8 MG/ML ophthalmic solution Place 1 drop into both eyes 2 (two) times daily.     methocarbamol (ROBAXIN) 750 MG tablet 1 tablet Orally in the evening for 30 days As needed muscle spasm     Multiple Vitamins-Minerals (PRESERVISION AREDS 2) CAPS Take 1 capsule by mouth 2 (two) times daily.     olmesartan (BENICAR) 20 MG tablet Take 1 tablet (20 mg total) by mouth daily. 90 tablet 3   Omega-3 1000 MG CAPS Take 1,000 mg by mouth daily.     omeprazole (PRILOSEC) 20 MG capsule Take 20 mg by mouth daily as needed (for heartburn or acid reflux). Reported on 07/18/2015     protein supplement shake (PREMIER PROTEIN) LIQD Take 2 oz by mouth daily.     rosuvastatin (CRESTOR) 10 MG tablet Take 1 tablet (10 mg total) by mouth daily. 30 tablet 11   saccharomyces boulardii (FLORASTOR) 250 MG capsule Take 250 mg by mouth daily.     Turmeric (QC TUMERIC COMPLEX) 500 MG CAPS Take 2,000 mg by mouth daily. (Patient taking differently: Take 1,000 mg by mouth daily.)     Wheat Dextrin (BENEFIBER ON THE GO) PACK Take 1  Package by mouth daily. Added to coffee daily     No current facility-administered medications on file prior to visit.    Allergies  Allergen Reactions   Doxycycline Other (See Comments)    Stomach burning and pain   Lisinopril Other (See Comments)    Kidney failure   Shellfish Allergy Hives    There were no vitals taken for this visit.   Assessment/Plan:  1. Hypertension -  No problem-specific Assessment & Plan notes found for this encounter.      Thank you  Carmela Hurt, Pharm.D [facility] A Division of [facility] [address], [place], [place] [address]  Phone: [phone]; Fax: [phone]

## 2022-09-29 ENCOUNTER — Ambulatory Visit (HOSPITAL_COMMUNITY): Payer: Medicare PPO | Attending: Cardiology

## 2022-09-29 ENCOUNTER — Ambulatory Visit (INDEPENDENT_AMBULATORY_CARE_PROVIDER_SITE_OTHER): Payer: Medicare PPO | Admitting: Student

## 2022-09-29 VITALS — BP 130/60 | HR 60

## 2022-09-29 DIAGNOSIS — I10 Essential (primary) hypertension: Secondary | ICD-10-CM

## 2022-09-29 LAB — ECHOCARDIOGRAM COMPLETE
Area-P 1/2: 4.89 cm2
S' Lateral: 2.5 cm

## 2022-09-29 NOTE — Patient Instructions (Signed)
No medication changes made by your pharmacist Carmela Hurt, PharmD at today's visit:      HOW TO TAKE YOUR BLOOD PRESSURE AT HOME  Rest 5 minutes before taking your blood pressure.  Don't smoke or drink caffeinated beverages for at least 30 minutes before. Take your blood pressure before (not after) you eat. Sit comfortably with your back supported and both feet on the floor (don't cross your legs). Elevate your arm to heart level on a table or a desk. Use the proper sized cuff. It should fit smoothly and snugly around your bare upper arm. There should be enough room to slip a fingertip under the cuff. The bottom edge of the cuff should be 1 inch above the crease of the elbow. Ideally, take 3 measurements at one sitting and record the average.  Important lifestyle changes to control high blood pressure  Intervention  Effect on the BP  Lose extra pounds and watch your waistline Weight loss is one of the most effective lifestyle changes for controlling blood pressure. If you're overweight or obese, losing even a small amount of weight can help reduce blood pressure. Blood pressure might go down by about 1 millimeter of mercury (mm Hg) with each kilogram (about 2.2 pounds) of weight lost.  Exercise regularly As a general goal, aim for at least 30 minutes of moderate physical activity every day. Regular physical activity can lower high blood pressure by about 5 to 8 mm Hg.  Eat a healthy diet Eating a diet rich in whole grains, fruits, vegetables, and low-fat dairy products and low in saturated fat and cholesterol. A healthy diet can lower high blood pressure by up to 11 mm Hg.  Reduce salt (sodium) in your diet Even a small reduction of sodium in the diet can improve heart health and reduce high blood pressure by about 5 to 6 mm Hg.  Limit alcohol One drink equals 12 ounces of beer, 5 ounces of wine, or 1.5 ounces of 80-proof liquor.  Limiting alcohol to less than one drink a day for women or  two drinks a day for men can help lower blood pressure by about 4 mm Hg.   If you have any questions or concerns please use My Chart to send questions or call the office at [phone]

## 2022-09-29 NOTE — Assessment & Plan Note (Signed)
Assessment: BP is controlled in office BP 130/60 heart rate 60 (goal <130/80) Home cuff validated today - it is accurate reads within 10 points compared to office cuff Takes the current BP medications regularly and tolerates them well without any side effects Denies SOB, palpitation, chest pain, headaches,or swelling Due to GI issue not been able to go to gym but eats low salt low fat healthy home cooked meals    Plan:  Continue taking Amlodipine 10 mg daily, olmesartan 20 mg daily  Patient to keep record of BP readings with heart rate and report to [place] at the next visit Patient to see PharmD in 8 weeks for follow up  Follow up lab(s):none

## 2022-10-07 ENCOUNTER — Telehealth: Payer: Self-pay | Admitting: Interventional Cardiology

## 2022-10-07 NOTE — Telephone Encounter (Signed)
Pt is calling to let Dr. Eldridge Dace and RN know that she stopped taking crestor yesterday due to extreme side effects of joint pain and muscle aches all over.   Pt states she can hardly move around due to the joint pain.   Pt states she stopped taking this today and will monitor to see if symptoms improve with hold.  Pt states her joints/muscles were fine and noticed the change a few days after taking crestor.  She states she tried taking this all long as possible, but had to stop due to the intolerance.  Pt is aware that Dr. Eldridge Dace and his RN are out of the office today, but I will route this message to them to further review and advise on an alternative and tolerable regimen in place of crestor.   Pt verbalized understanding and agrees with this plan.

## 2022-10-07 NOTE — Telephone Encounter (Signed)
Pt c/o medication issue:  1. Name of Medication: rosuvastatin (CRESTOR) 10 MG tablet   2. How are you currently taking this medication (dosage and times per day)? Take 1 tablet (10 mg total) by mouth daily.   3. Are you having a reaction (difficulty breathing--STAT)? No  4. What is your medication issue? Pt is currently having joint pain and cramps in both legs. Pt thinks it could be coming from medication she would like a callback regarding this matter. Please advise.

## 2022-10-07 NOTE — Telephone Encounter (Signed)
Can hold Crestor for 2 weeks and then retry Crestor 10 mg dose once a week.  If sx recur, can add CoQ10 100 mg daily.   Liver lipids in three months.    If she declines a retry of Crestor, can refer to PharmD lipid clinic.  SHe already sees them for HTN.

## 2022-10-08 MED ORDER — ROSUVASTATIN CALCIUM 10 MG PO TABS: ORAL_TABLET | ORAL | 6 refills | Status: DC

## 2022-10-08 NOTE — Telephone Encounter (Signed)
Patient notified.  She is hesitant to wait 3 months for lab work.  She will come in for lab work as previously scheduled on 8/27.  She will start once a week dosing of Rosuvastatin around July 3.  Patient will let us know if she cannot tolerate decreased dose.

## 2022-10-20 DIAGNOSIS — K625 Hemorrhage of anus and rectum: Secondary | ICD-10-CM | POA: Diagnosis not present

## 2022-11-04 DIAGNOSIS — K529 Noninfective gastroenteritis and colitis, unspecified: Secondary | ICD-10-CM | POA: Diagnosis not present

## 2022-11-04 DIAGNOSIS — R1032 Left lower quadrant pain: Secondary | ICD-10-CM | POA: Diagnosis not present

## 2022-11-04 DIAGNOSIS — K648 Other hemorrhoids: Secondary | ICD-10-CM | POA: Diagnosis not present

## 2022-11-04 DIAGNOSIS — K921 Melena: Secondary | ICD-10-CM | POA: Diagnosis not present

## 2022-11-04 DIAGNOSIS — K5730 Diverticulosis of large intestine without perforation or abscess without bleeding: Secondary | ICD-10-CM | POA: Diagnosis not present

## 2022-11-06 DIAGNOSIS — K529 Noninfective gastroenteritis and colitis, unspecified: Secondary | ICD-10-CM | POA: Diagnosis not present

## 2022-12-01 ENCOUNTER — Ambulatory Visit: Payer: Medicare PPO | Admitting: Pharmacist

## 2022-12-01 VITALS — BP 124/74 | HR 69

## 2022-12-01 DIAGNOSIS — R931 Abnormal findings on diagnostic imaging of heart and coronary circulation: Secondary | ICD-10-CM | POA: Diagnosis not present

## 2022-12-01 DIAGNOSIS — I10 Essential (primary) hypertension: Secondary | ICD-10-CM | POA: Diagnosis not present

## 2022-12-01 DIAGNOSIS — R7303 Prediabetes: Secondary | ICD-10-CM

## 2022-12-01 LAB — COMPREHENSIVE METABOLIC PANEL
ALT: 27 IU/L (ref 0–32)
AST: 24 IU/L (ref 0–40)
Albumin: 4.5 g/dL (ref 3.9–4.9)
Alkaline Phosphatase: 96 IU/L (ref 44–121)
BUN/Creatinine Ratio: 14 (ref 12–28)
BUN: 15 mg/dL (ref 8–27)
Bilirubin Total: 0.3 mg/dL (ref 0.0–1.2)
CO2: 27 mmol/L (ref 20–29)
Calcium: 10.3 mg/dL (ref 8.7–10.3)
Chloride: 100 mmol/L (ref 96–106)
Creatinine, Ser: 1.09 mg/dL — ABNORMAL HIGH (ref 0.57–1.00)
Globulin, Total: 3 g/dL (ref 1.5–4.5)
Glucose: 94 mg/dL (ref 70–99)
Potassium: 4.6 mmol/L (ref 3.5–5.2)
Sodium: 138 mmol/L (ref 134–144)
Total Protein: 7.5 g/dL (ref 6.0–8.5)
eGFR: 55 mL/min/{1.73_m2} — ABNORMAL LOW (ref >59)

## 2022-12-01 LAB — LIPID PANEL
Chol/HDL Ratio: 2.9 ratio (ref 0.0–4.4)
Cholesterol, Total: 222 mg/dL — ABNORMAL HIGH (ref 100–199)
HDL: 76 mg/dL (ref >39)
LDL Chol Calc (NIH): 131 mg/dL — ABNORMAL HIGH (ref 0–99)
Triglycerides: 88 mg/dL (ref 0–149)
VLDL Cholesterol Cal: 15 mg/dL (ref 5–40)

## 2022-12-01 LAB — HEMOGLOBIN A1C

## 2022-12-01 NOTE — Assessment & Plan Note (Signed)
Assessment: Coronary calcium score 47 We discussed the percentile that this places patient.  Score is below the 50th percentile and to be expected for patient's age Patient does have risk factors for CAD including hypertension, CKD and age Patient does not want to take a statin due to its potential to push her from prediabetes to diabetes She desires to know what her LDL cholesterol is as it has not been checked in a while Feels as though her LDL increased while she was on tamoxifen for breast cancer.  She has been off of this for about a year We did discuss the benefits and the risks of treating cholesterol We reviewed other risk factors for CVD including hypertension which is well-controlled, exercise, diet  Plan: Will check lipid panel today.  She is not fasting but only had a banana Will discuss results with patient She will not take a statin but could consider a statin alternative if patient desired

## 2022-12-01 NOTE — Assessment & Plan Note (Signed)
Assessment: Blood pressure at goal in clinic and at home of less than 130/80 Patient denies any signs symptoms of low blood pressure  Plan: Continue amlodipine 10 mg daily and olmesartan 20 mg daily Will check a CMP today as kidney function has not been checked since increase in olmesartan dose Follow-up as needed

## 2022-12-01 NOTE — Progress Notes (Unsigned)
Patient ID: [name]                 DOB: 07-Aug-1950                      MRN: [id number]      HPI: Karen Roth is a 72 y.o. female referred by Dr. Eldridge Dace to HTN clinic. PMH is significant for breast cancer, HTN stage 3b CKD (last eGFR 54 on 06/06/2022)(CrCl 61 mL/min). She has been on BP meds for several years.  She had some kidney issue.  She has been followed by [facility].  She has been on lisinopril/ HCTZ but had side effects- itching and hives.  She was then changed to amlodipine. Ultimately olmesartan was started.  At the last visit with Dr. Eldridge Dace BP was 158/80 so amlodipine dose was increased to 10 mg.  At initial visit with PharmD 6/11, BP was 130/60. Home BP monitor found to be accurate. Home readings averaged ~120/60. No medication changes were made.  She was started on rosuvastatin 10 mg daily.  However she called the office reporting side effects and was advised to hold for 2 weeks and then restart at 10 mg once a week.  Patient presents today for follow-up.  She brings in the records of her blood pressure which are well-controlled.  She denies any dizziness or lightheadedness.  She reports that she never did resume rosuvastatin.  She does not want to take due to the way it made her feel (muscle pains, could not function increase her blood sugar).  She is also concerned about statins pushing her from prediabetes to diabetes.  She would like to check her LDL cholesterol to see where she is at.  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  She believes her GI issues started when she ate at a [employer].  She is back to exercising about 4 days a week at the [facility].  Patient states her health is important to her and she pays attention to her diet.  Current HTN meds: amlodipine 10 mg daily, olmesartan 20 mg daily  Previously tried: HCTZ hives, itching  BP goal: <130/80  Home BP validation readings   133/67 heart rate 69 on home cuff 130/60 heart rate 60 on office cuff 123/60 heart rate 60 on home cuff 116/60 heart rate on office cuff  Family History:  Father: cancer, COPD, T2DM  Mother: heart disease, had stent put in her late 65's Sister: HTN  Brother: heart problems, blood clots, HTN   Social History:  Alcohol: rarely  Smoking: quit 10 years ago   Diet: low salt diet  Mostly eat home cooked meals due to her GI issues   Exercise: back at [facility], however like being outside doing yard work   Home BP readings: ~120/60 heart rate 60 -65    Wt Readings from Last 3 Encounters:  08/31/22 179 lb 9.6 oz (81.5 kg)  06/06/22 178 lb 9.2 oz (81 kg)  06/01/22 178 lb 9.2 oz (81 kg)   BP Readings from Last 3 Encounters:  12/01/22 124/74  09/29/22 130/60  08/31/22 (!) 158/80   Pulse Readings from Last 3 Encounters:  12/01/22 69  09/29/22 60  08/31/22 67    Renal function: CrCl cannot be calculated (Patient's most recent lab result is older than the maximum 21 days allowed.).  Past Medical History:  Diagnosis Date   Anxiety    takes Xanax  daily as needed   Arthritis    Breast cancer (HCC)    Diverticulosis    GERD (gastroesophageal reflux disease)    takes Omeprazole daily as needed   Headache    more so since diagnosis of breast cancer   History of bronchitis > 20 yrs ago   History of colon polyps    benign   History of renal failure 2013   saw a kidney specialist and was released a yr ago.Thought it was back related   Hypertension    takes Amlodipine daily   Neck pain    and right shoulder.Arthritis and Bone Spurs   Osteoarthritis    Personal history of radiation therapy    2017   Urinary frequency     Current Outpatient Medications on File Prior to Visit  Medication Sig Dispense Refill   mesalamine (APRISO) 0.375 g 24 hr capsule Take 375 mg by mouth daily.     acetaminophen (TYLENOL) 500 MG tablet Take 500-1,000 mg by mouth daily as needed for mild pain  or headache. Reported on 08/20/2015     ALPRAZolam (XANAX) 0.5 MG tablet Take 0.5 mg by mouth 3 (three) times daily as needed for anxiety.      amLODipine (NORVASC) 10 MG tablet Take 1 tablet (10 mg total) by mouth daily. 90 tablet 3   APPLE CIDER VINEGAR PO Take 15 mLs by mouth 3 (three) times a week.     azelastine (OPTIVAR) 0.05 % ophthalmic solution Apply 1 drop to eye 2 (two) times daily.     Calcium Citrate-Vitamin D (CITRACAL + D PO) Take 1 tablet by mouth daily.      Cholecalciferol (VITAMIN D3) 2000 units capsule Take 2,000 Units by mouth daily.      dorzolamide-timolol (COSOPT) 22.3-6.8 MG/ML ophthalmic solution Place 1 drop into both eyes 2 (two) times daily.     methocarbamol (ROBAXIN) 750 MG tablet 1 tablet Orally in the evening for 30 days As needed muscle spasm     Multiple Vitamins-Minerals (PRESERVISION AREDS 2) CAPS Take 1 capsule by mouth 2 (two) times daily.     olmesartan (BENICAR) 20 MG tablet Take 1 tablet (20 mg total) by mouth daily. 90 tablet 3   Omega-3 1000 MG CAPS Take 1,000 mg by mouth daily.     omeprazole (PRILOSEC) 20 MG capsule Take 20 mg by mouth daily as needed (for heartburn or acid reflux). Reported on 07/18/2015     protein supplement shake (PREMIER PROTEIN) LIQD Take 2 oz by mouth daily.     saccharomyces boulardii (FLORASTOR) 250 MG capsule Take 250 mg by mouth daily.     Turmeric (QC TUMERIC COMPLEX) 500 MG CAPS Take 2,000 mg by mouth daily. (Patient taking differently: Take 1,000 mg by mouth daily.)     Wheat Dextrin (BENEFIBER ON THE GO) PACK Take 1 Package by mouth daily. Added to coffee daily     No current facility-administered medications on file prior to visit.    Allergies  Allergen Reactions   Doxycycline Other (See Comments)    Stomach burning and pain   Lisinopril Other (See Comments)    Kidney failure   Shellfish Allergy Hives    Blood pressure 124/74, pulse 69.   Essential hypertension Assessment: Blood pressure at goal in clinic  and at home of less than 130/80 Patient denies any signs symptoms of low blood pressure  Plan: Continue amlodipine 10 mg daily and olmesartan 20 mg daily Will check a CMP today as  kidney function has not been checked since increase in olmesartan dose Follow-up as needed  Agatston coronary artery calcium score less than 100 Assessment: Coronary calcium score 47 We discussed the percentile that this places patient.  Score is below the 50th percentile and to be expected for patient's age Patient does have risk factors for CAD including hypertension, CKD and age Patient does not want to take a statin due to its potential to push her from prediabetes to diabetes She desires to know what her LDL cholesterol is as it has not been checked in a while Feels as though her LDL increased while she was on tamoxifen for breast cancer.  She has been off of this for about a year We did discuss the benefits and the risks of treating cholesterol We reviewed other risk factors for CVD including hypertension which is well-controlled, exercise, diet  Plan: Will check lipid panel today.  She is not fasting but only had a banana Will discuss results with patient She will not take a statin but could consider a statin alternative if patient desired   Thank you  Olene Floss, Pharm.D, BCACP, BCPS, CPP [facility] A Division of [facility] [address] [place], [place] [address]  Phone: [phone]; Fax: [phone]

## 2022-12-01 NOTE — Patient Instructions (Addendum)
Continue taking amlodipine 10 mg daily, olmesartan 20 mg daily  Please call us with any issues [phone]

## 2022-12-02 DIAGNOSIS — K5120 Ulcerative (chronic) proctitis without complications: Secondary | ICD-10-CM | POA: Diagnosis not present

## 2022-12-02 DIAGNOSIS — N1831 Chronic kidney disease, stage 3a: Secondary | ICD-10-CM | POA: Diagnosis not present

## 2022-12-02 DIAGNOSIS — I129 Hypertensive chronic kidney disease with stage 1 through stage 4 chronic kidney disease, or unspecified chronic kidney disease: Secondary | ICD-10-CM | POA: Diagnosis not present

## 2022-12-02 DIAGNOSIS — M62830 Muscle spasm of back: Secondary | ICD-10-CM | POA: Diagnosis not present

## 2022-12-02 DIAGNOSIS — I700 Atherosclerosis of aorta: Secondary | ICD-10-CM | POA: Diagnosis not present

## 2022-12-03 ENCOUNTER — Telehealth: Payer: Self-pay | Admitting: Pharmacist

## 2022-12-03 NOTE — Telephone Encounter (Signed)
Patient returning Melissa's call to discuss non-statin medication to lower LDLc.  Appointment scheduled for Dec 15 2022 at 2:30

## 2022-12-14 DIAGNOSIS — H18513 Endothelial corneal dystrophy, bilateral: Secondary | ICD-10-CM | POA: Diagnosis not present

## 2022-12-14 DIAGNOSIS — H0102A Squamous blepharitis right eye, upper and lower eyelids: Secondary | ICD-10-CM | POA: Diagnosis not present

## 2022-12-14 DIAGNOSIS — H35033 Hypertensive retinopathy, bilateral: Secondary | ICD-10-CM | POA: Diagnosis not present

## 2022-12-14 DIAGNOSIS — Z961 Presence of intraocular lens: Secondary | ICD-10-CM | POA: Diagnosis not present

## 2022-12-14 DIAGNOSIS — H0102B Squamous blepharitis left eye, upper and lower eyelids: Secondary | ICD-10-CM | POA: Diagnosis not present

## 2022-12-14 DIAGNOSIS — H353132 Nonexudative age-related macular degeneration, bilateral, intermediate dry stage: Secondary | ICD-10-CM | POA: Diagnosis not present

## 2022-12-14 DIAGNOSIS — H43811 Vitreous degeneration, right eye: Secondary | ICD-10-CM | POA: Diagnosis not present

## 2022-12-14 DIAGNOSIS — H401231 Low-tension glaucoma, bilateral, mild stage: Secondary | ICD-10-CM | POA: Diagnosis not present

## 2022-12-15 ENCOUNTER — Other Ambulatory Visit: Payer: Medicare PPO

## 2022-12-15 ENCOUNTER — Ambulatory Visit: Payer: Medicare PPO | Attending: Cardiology | Admitting: Pharmacist

## 2022-12-15 DIAGNOSIS — R931 Abnormal findings on diagnostic imaging of heart and coronary circulation: Secondary | ICD-10-CM

## 2022-12-15 NOTE — Assessment & Plan Note (Signed)
Assessment: LDL-C is above goal of <70 Patient does not want to add any medications at this time Upset that her LDL-C is up Reviewed diet Exercises regularly Has has more stress lately which could contribute Wants to get labs repeated at PCP 9/18  Plan: Repeat labs with PCP Patient will contact me if she would like to pursue medication

## 2022-12-15 NOTE — Progress Notes (Unsigned)
Patient ID: [name]                 DOB: 1950-04-22                      MRN: [id number]      HPI: Karen Roth is a 72 y.o. female referred by Dr. Eldridge Dace to HTN clinic. PMH is significant for breast cancer, HTN stage 3b CKD (last eGFR 54 on 06/06/2022)(CrCl 61 mL/min).She was started on rosuvastatin 10 mg daily.  However she called the office reporting side effects and was advised to hold for 2 weeks and then restart at 10 mg once a week. However, patient did not restart and is not interested in trying any more statins.  She has a CAC in May which showed a score of 47. Patient seen in clinic for her BP 8/13. She mentioned that she did not resume her rosuvastatin. She wanted to get another lipid panel before considering another medication. Repeat LDL-C was 130.  Patient presents today for follow-up.  She brings in the records of her LDL-C since 2014. Upset that she doesn't have an LDL-C from 2023. Also upset that her LDL-C is up. Was 89 in 2014. She thinks it may have been from stress. Was having issues with blood in stool and problems eating. These have resolved and she is not feeling stressed. She stopped her Mesalamine because she thinks it was causing her swelling and chest pain.   LDL-C since 2014 89 100 75 81 89 105 106 131  Current lipid meds: none Intolerances: rosuvastatin 10mg  daily LDL-C goal: <70 ideally, at least <100 Current HTN meds: amlodipine 10 mg daily, olmesartan 20 mg daily  Previously tried: HCTZ hives, itching  BP goal: <130/80  Home BP validation readings  133/67 heart rate 69 on home cuff 130/60 heart rate 60 on office cuff 123/60 heart rate 60 on home cuff 116/60 heart rate on office cuff  Family History:  Father: cancer, COPD, T2DM  Mother: heart disease, had stent put in her late 58's Sister: HTN  Brother: heart problems, blood clots, HTN   Social History:  Alcohol: rarely  Smoking: quit 10 years ago   Diet: low salt  diet  Mostly eat home cooked meals due to her GI issues  Chicken, [place], fish Lean pork every now and then Loves vegetables EVOO   Exercise: back at [facility], however like being outside doing yard work   Home BP readings: ~120/60 heart rate 60 -65    Wt Readings from Last 3 Encounters:  08/31/22 179 lb 9.6 oz (81.5 kg)  06/06/22 178 lb 9.2 oz (81 kg)  06/01/22 178 lb 9.2 oz (81 kg)   BP Readings from Last 3 Encounters:  12/01/22 124/74  09/29/22 130/60  08/31/22 (!) 158/80   Pulse Readings from Last 3 Encounters:  12/01/22 69  09/29/22 60  08/31/22 67    Renal function: CrCl cannot be calculated (Unknown ideal weight.).  Past Medical History:  Diagnosis Date   Anxiety    takes Xanax daily as needed   Arthritis    Breast cancer (HCC)    Diverticulosis    GERD (gastroesophageal reflux disease)    takes Omeprazole daily as needed   Headache    more so since diagnosis of breast cancer   History of bronchitis > 20 yrs ago   History of colon polyps    benign   History of renal failure 2013  saw a kidney specialist and was released a yr ago.Thought it was back related   Hypertension    takes Amlodipine daily   Neck pain    and right shoulder.Arthritis and Bone Spurs   Osteoarthritis    Personal history of radiation therapy    2017   Urinary frequency     Current Outpatient Medications on File Prior to Visit  Medication Sig Dispense Refill   acetaminophen (TYLENOL) 500 MG tablet Take 500-1,000 mg by mouth daily as needed for mild pain or headache. Reported on 08/20/2015     ALPRAZolam (XANAX) 0.5 MG tablet Take 0.5 mg by mouth 3 (three) times daily as needed for anxiety.      amLODipine (NORVASC) 10 MG tablet Take 1 tablet (10 mg total) by mouth daily. 90 tablet 3   APPLE CIDER VINEGAR PO Take 15 mLs by mouth 3 (three) times a week.     azelastine (OPTIVAR) 0.05 % ophthalmic solution Apply 1 drop to eye 2 (two) times daily.     Calcium Citrate-Vitamin D (CITRACAL  + D PO) Take 1 tablet by mouth daily.      Cholecalciferol (VITAMIN D3) 2000 units capsule Take 2,000 Units by mouth daily.      dorzolamide-timolol (COSOPT) 22.3-6.8 MG/ML ophthalmic solution Place 1 drop into both eyes 2 (two) times daily.     methocarbamol (ROBAXIN) 750 MG tablet 1 tablet Orally in the evening for 30 days As needed muscle spasm     Multiple Vitamins-Minerals (PRESERVISION AREDS 2) CAPS Take 1 capsule by mouth 2 (two) times daily.     olmesartan (BENICAR) 20 MG tablet Take 1 tablet (20 mg total) by mouth daily. 90 tablet 3   Omega-3 1000 MG CAPS Take 1,000 mg by mouth daily.     omeprazole (PRILOSEC) 20 MG capsule Take 20 mg by mouth daily as needed (for heartburn or acid reflux). Reported on 07/18/2015     protein supplement shake (PREMIER PROTEIN) LIQD Take 2 oz by mouth daily.     saccharomyces boulardii (FLORASTOR) 250 MG capsule Take 250 mg by mouth daily.     Turmeric (QC TUMERIC COMPLEX) 500 MG CAPS Take 2,000 mg by mouth daily. (Patient taking differently: Take 1,000 mg by mouth daily.)     Wheat Dextrin (BENEFIBER ON THE GO) PACK Take 1 Package by mouth daily. Added to coffee daily     No current facility-administered medications on file prior to visit.    Allergies  Allergen Reactions   Doxycycline Other (See Comments)    Stomach burning and pain   Lisinopril Other (See Comments)    Kidney failure   Shellfish Allergy Hives    There were no vitals taken for this visit.   Agatston coronary artery calcium score less than 100 Assessment: LDL-C is above goal of <70 Patient does not want to add any medications at this time Upset that her LDL-C is up Reviewed diet Exercises regularly Has has more stress lately which could contribute Wants to get labs repeated at PCP 9/18  Plan: Repeat labs with PCP Patient will contact me if she would like to pursue medication    Thank you  Olene Floss, Pharm.D, BCACP, BCPS, CPP [facility] A  Division of [facility] [address] [place], [place] [address]  Phone: [phone]; Fax: [phone]

## 2022-12-16 ENCOUNTER — Ambulatory Visit: Payer: Medicare PPO

## 2023-01-04 DIAGNOSIS — K5130 Ulcerative (chronic) rectosigmoiditis without complications: Secondary | ICD-10-CM | POA: Diagnosis not present

## 2023-01-06 DIAGNOSIS — R600 Localized edema: Secondary | ICD-10-CM | POA: Diagnosis not present

## 2023-01-06 DIAGNOSIS — I129 Hypertensive chronic kidney disease with stage 1 through stage 4 chronic kidney disease, or unspecified chronic kidney disease: Secondary | ICD-10-CM | POA: Diagnosis not present

## 2023-01-06 DIAGNOSIS — N1831 Chronic kidney disease, stage 3a: Secondary | ICD-10-CM | POA: Diagnosis not present

## 2023-01-06 DIAGNOSIS — R7303 Prediabetes: Secondary | ICD-10-CM | POA: Diagnosis not present

## 2023-01-06 DIAGNOSIS — Z86000 Personal history of in-situ neoplasm of breast: Secondary | ICD-10-CM | POA: Diagnosis not present

## 2023-01-06 DIAGNOSIS — H409 Unspecified glaucoma: Secondary | ICD-10-CM | POA: Diagnosis not present

## 2023-01-06 DIAGNOSIS — Z0000 Encounter for general adult medical examination without abnormal findings: Secondary | ICD-10-CM | POA: Diagnosis not present

## 2023-01-06 DIAGNOSIS — I700 Atherosclerosis of aorta: Secondary | ICD-10-CM | POA: Diagnosis not present

## 2023-01-06 DIAGNOSIS — K219 Gastro-esophageal reflux disease without esophagitis: Secondary | ICD-10-CM | POA: Diagnosis not present

## 2023-01-06 DIAGNOSIS — M8580 Other specified disorders of bone density and structure, unspecified site: Secondary | ICD-10-CM | POA: Diagnosis not present

## 2023-01-11 ENCOUNTER — Encounter (INDEPENDENT_AMBULATORY_CARE_PROVIDER_SITE_OTHER): Payer: Medicare PPO | Admitting: Ophthalmology

## 2023-01-11 DIAGNOSIS — H353132 Nonexudative age-related macular degeneration, bilateral, intermediate dry stage: Secondary | ICD-10-CM | POA: Diagnosis not present

## 2023-01-11 DIAGNOSIS — H35033 Hypertensive retinopathy, bilateral: Secondary | ICD-10-CM | POA: Diagnosis not present

## 2023-01-11 DIAGNOSIS — I10 Essential (primary) hypertension: Secondary | ICD-10-CM | POA: Diagnosis not present

## 2023-01-11 DIAGNOSIS — H43813 Vitreous degeneration, bilateral: Secondary | ICD-10-CM | POA: Diagnosis not present

## 2023-03-10 DIAGNOSIS — H16223 Keratoconjunctivitis sicca, not specified as Sjogren's, bilateral: Secondary | ICD-10-CM | POA: Diagnosis not present

## 2023-03-10 DIAGNOSIS — H0011 Chalazion right upper eyelid: Secondary | ICD-10-CM | POA: Diagnosis not present

## 2023-03-10 DIAGNOSIS — H0102B Squamous blepharitis left eye, upper and lower eyelids: Secondary | ICD-10-CM | POA: Diagnosis not present

## 2023-03-10 DIAGNOSIS — H0102A Squamous blepharitis right eye, upper and lower eyelids: Secondary | ICD-10-CM | POA: Diagnosis not present

## 2023-03-10 DIAGNOSIS — H18513 Endothelial corneal dystrophy, bilateral: Secondary | ICD-10-CM | POA: Diagnosis not present

## 2023-03-10 DIAGNOSIS — H401231 Low-tension glaucoma, bilateral, mild stage: Secondary | ICD-10-CM | POA: Diagnosis not present

## 2023-03-10 DIAGNOSIS — Z961 Presence of intraocular lens: Secondary | ICD-10-CM | POA: Diagnosis not present

## 2023-04-20 DIAGNOSIS — H353132 Nonexudative age-related macular degeneration, bilateral, intermediate dry stage: Secondary | ICD-10-CM | POA: Diagnosis not present

## 2023-04-20 DIAGNOSIS — Z961 Presence of intraocular lens: Secondary | ICD-10-CM | POA: Diagnosis not present

## 2023-04-20 DIAGNOSIS — H18513 Endothelial corneal dystrophy, bilateral: Secondary | ICD-10-CM | POA: Diagnosis not present

## 2023-04-20 DIAGNOSIS — H0102A Squamous blepharitis right eye, upper and lower eyelids: Secondary | ICD-10-CM | POA: Diagnosis not present

## 2023-04-20 DIAGNOSIS — H43811 Vitreous degeneration, right eye: Secondary | ICD-10-CM | POA: Diagnosis not present

## 2023-04-20 DIAGNOSIS — H0102B Squamous blepharitis left eye, upper and lower eyelids: Secondary | ICD-10-CM | POA: Diagnosis not present

## 2023-04-20 DIAGNOSIS — H401231 Low-tension glaucoma, bilateral, mild stage: Secondary | ICD-10-CM | POA: Diagnosis not present

## 2023-04-20 DIAGNOSIS — H35033 Hypertensive retinopathy, bilateral: Secondary | ICD-10-CM | POA: Diagnosis not present

## 2023-05-18 DIAGNOSIS — K5130 Ulcerative (chronic) rectosigmoiditis without complications: Secondary | ICD-10-CM | POA: Diagnosis not present

## 2023-05-19 DIAGNOSIS — H43812 Vitreous degeneration, left eye: Secondary | ICD-10-CM | POA: Diagnosis not present

## 2023-05-19 DIAGNOSIS — K5130 Ulcerative (chronic) rectosigmoiditis without complications: Secondary | ICD-10-CM | POA: Diagnosis not present

## 2023-07-13 DIAGNOSIS — N958 Other specified menopausal and perimenopausal disorders: Secondary | ICD-10-CM | POA: Diagnosis not present

## 2023-07-13 DIAGNOSIS — M8588 Other specified disorders of bone density and structure, other site: Secondary | ICD-10-CM | POA: Diagnosis not present

## 2023-07-13 DIAGNOSIS — Z6830 Body mass index (BMI) 30.0-30.9, adult: Secondary | ICD-10-CM | POA: Diagnosis not present

## 2023-07-13 DIAGNOSIS — Z779 Other contact with and (suspected) exposures hazardous to health: Secondary | ICD-10-CM | POA: Diagnosis not present

## 2023-07-13 DIAGNOSIS — Z1231 Encounter for screening mammogram for malignant neoplasm of breast: Secondary | ICD-10-CM | POA: Diagnosis not present

## 2023-07-13 DIAGNOSIS — Z853 Personal history of malignant neoplasm of breast: Secondary | ICD-10-CM | POA: Diagnosis not present

## 2023-09-01 DIAGNOSIS — H0102B Squamous blepharitis left eye, upper and lower eyelids: Secondary | ICD-10-CM | POA: Diagnosis not present

## 2023-09-01 DIAGNOSIS — Z961 Presence of intraocular lens: Secondary | ICD-10-CM | POA: Diagnosis not present

## 2023-09-01 DIAGNOSIS — H35033 Hypertensive retinopathy, bilateral: Secondary | ICD-10-CM | POA: Diagnosis not present

## 2023-09-01 DIAGNOSIS — H401231 Low-tension glaucoma, bilateral, mild stage: Secondary | ICD-10-CM | POA: Diagnosis not present

## 2023-09-01 DIAGNOSIS — H353132 Nonexudative age-related macular degeneration, bilateral, intermediate dry stage: Secondary | ICD-10-CM | POA: Diagnosis not present

## 2023-09-01 DIAGNOSIS — H43812 Vitreous degeneration, left eye: Secondary | ICD-10-CM | POA: Diagnosis not present

## 2023-09-01 DIAGNOSIS — H43811 Vitreous degeneration, right eye: Secondary | ICD-10-CM | POA: Diagnosis not present

## 2023-09-01 DIAGNOSIS — H18513 Endothelial corneal dystrophy, bilateral: Secondary | ICD-10-CM | POA: Diagnosis not present

## 2023-09-01 DIAGNOSIS — H0102A Squamous blepharitis right eye, upper and lower eyelids: Secondary | ICD-10-CM | POA: Diagnosis not present

## 2023-09-07 ENCOUNTER — Other Ambulatory Visit: Payer: Self-pay

## 2023-09-07 ENCOUNTER — Telehealth: Payer: Self-pay | Admitting: Internal Medicine

## 2023-09-07 MED ORDER — OLMESARTAN MEDOXOMIL 20 MG PO TABS: 20.0000 mg | ORAL_TABLET | Freq: Every day | ORAL | 0 refills | Status: DC

## 2023-09-07 NOTE — Telephone Encounter (Signed)
*  STAT* If patient is at the pharmacy, call can be transferred to refill team.   1. Which medications need to be refilled? (please list name of each medication and dose if known)   olmesartan  (BENICAR ) 20 MG tablet    4. Which pharmacy/location (including street and city if local pharmacy) is medication to be sent to?  [id number] - [place], [place] - [address] AT [facility] OF [address]     5. Do they need a 30 day or 90 day supply? 90    Pt scheduled for 10/29/23

## 2023-09-07 NOTE — Telephone Encounter (Signed)
 Pt's medication was sent to pt's pharmacy as requested. Confirmation received.

## 2023-10-29 ENCOUNTER — Ambulatory Visit: Attending: Internal Medicine | Admitting: Internal Medicine

## 2023-10-29 ENCOUNTER — Encounter: Payer: Self-pay | Admitting: Internal Medicine

## 2023-10-29 VITALS — BP 121/74 | HR 67 | Ht 66.0 in | Wt 184.0 lb

## 2023-10-29 DIAGNOSIS — R931 Abnormal findings on diagnostic imaging of heart and coronary circulation: Secondary | ICD-10-CM | POA: Diagnosis not present

## 2023-10-29 DIAGNOSIS — E785 Hyperlipidemia, unspecified: Secondary | ICD-10-CM | POA: Diagnosis not present

## 2023-10-29 DIAGNOSIS — I10 Essential (primary) hypertension: Secondary | ICD-10-CM

## 2023-10-29 NOTE — Patient Instructions (Signed)
 Medication Instructions:  The current medical regimen is effective;  continue present plan and medications.  *If you need a refill on your cardiac medications before your next appointment, please call your pharmacy*   Follow-Up: At [facility], you and your health needs are our priority.  As part of our continuing mission to provide you with exceptional heart care, our providers are all part of one team.  This team includes your primary Cardiologist (physician) and Advanced Practice Providers or APPs (Physician Assistants and Nurse Practitioners) who all work together to provide you with the care you need, when you need it.  Your next appointment:   Follow up as needed   We recommend signing up for the patient portal called "MyChart".  Sign up information is provided on this After Visit Summary.  MyChart is used to connect with patients for Virtual Visits (Telemedicine).  Patients are able to view lab/test results, encounter notes, upcoming appointments, etc.  Non-urgent messages can be sent to your provider as well.   To learn more about what you can do with MyChart, go to [email or web address].

## 2023-10-29 NOTE — Progress Notes (Signed)
 Cardiology Office Note:  .    Date:  10/29/2023  ID:  Karen Roth, DOB [birth year], MRN [id number] PCP: Sun, Vyvyan, MD  [facility] Providers Cardiologist:  Stanly DELENA Leavens, MD     CC: CAD f/u  History of Present Illness: .    Karen Roth is a 73 y.o. female  with biatrial enlargement and chronic kidney disease who presents for secondary prevention of heart disease. She was previously under the care of Dr. Dann for prevention of heart disease.  She has a history of biatrial enlargement and chronic kidney disease with an unclear baseline. An echocardiogram performed in 2024 showed no valve abnormalities, and a coronary artery calcium  score revealed isolated LAD calcifications with a percentile score of 47%.  Her cholesterol levels, last recorded in September, were a total cholesterol of 227 mg/dL, HDL of 70 mg/dL, LDL of 871 mg/dL, and triglycerides of 829 mg/dL. She has experienced adverse effects from statins, including swelling, chest pain, and disorientation, leading her to discontinue their use.  She maintains an active lifestyle, engaging in strength training and walking at the gym four to five times a week. She also enjoys working in her garden. She has a history of scoliosis, for which she underwent two surgeries, one at age 43 and another in 2014, where a rod was removed due to discomfort.  No chest pain, breathing issues, or palpitations. She has a history of a heart murmur first noted in her twenties, but she reports no symptoms related to this.  Discussed the use of AI scribe software for clinical note transcription with the patient, who gave verbal consent to proceed.   Relevant histories: .  Social  - former [facility] patient; - The patient is physically active, going to the gym at least four times a week, engaging in strength training and cardio exercises. She enjoys working in her garden. She has a history of scoliosis and has undergone two  surgeries related to it. She has made dietary changes to manage her cholesterol levels.  ROS: As per HPI.   Studies Reviewed: .     Cardiac Studies & Procedures   ______________________________________________________________________________________________     ECHOCARDIOGRAM  ECHOCARDIOGRAM COMPLETE 09/29/2022  Narrative ECHOCARDIOGRAM REPORT    Patient Name:   Karen Roth Date of Exam: 09/29/2022 Medical Rec #:  [id number]             Height:       66.0 in Accession #:    [id number]            Weight:       179.6 lb Date of Birth:  [birth year]             BSA:          1.910 m Patient Age:    72 years              BP:           137/75 mmHg Patient Gender: F                     HR:           79 bpm. Exam Location:  [address]  Procedure: 2D Echo, 3D Echo, Cardiac Doppler, Color Doppler and Strain Analysis  Indications:    I10 Hypertension  History:        Patient has no prior history of Echocardiogram examinations. Breast cancer-history of radiotherapy.; Risk Factors:Hypertension and Family History of  Coronary Artery Disease.  Sonographer:    Nolon Berg BA, RDCS Referring Phys: CANDYCE GORMAN REEK  IMPRESSIONS   1. Left ventricular ejection fraction, by estimation, is 60 to 65%. The left ventricle has normal function. The left ventricle has no regional wall motion abnormalities. Left ventricular diastolic parameters were normal. 2. Right ventricular systolic function is normal. The right ventricular size is normal. 3. The mitral valve is normal in structure. Trivial mitral valve regurgitation. No evidence of mitral stenosis. 4. The aortic valve is normal in structure. Aortic valve regurgitation is not visualized. No aortic stenosis is present. 5. The inferior vena cava is normal in size with greater than 50% respiratory variability, suggesting right atrial pressure of 3 mmHg.  FINDINGS Left Ventricle: Left ventricular ejection fraction, by estimation,  is 60 to 65%. The left ventricle has normal function. The left ventricle has no regional wall motion abnormalities. The left ventricular internal cavity size was normal in size. There is no left ventricular hypertrophy. Left ventricular diastolic parameters were normal.  Right Ventricle: The right ventricular size is normal. No increase in right ventricular wall thickness. Right ventricular systolic function is normal.  Left Atrium: Left atrial size was normal in size.  Right Atrium: Right atrial size was normal in size.  Pericardium: There is no evidence of pericardial effusion.  Mitral Valve: The mitral valve is normal in structure. Trivial mitral valve regurgitation. No evidence of mitral valve stenosis.  Tricuspid Valve: The tricuspid valve is normal in structure. Tricuspid valve regurgitation is trivial. No evidence of tricuspid stenosis.  Aortic Valve: The aortic valve is normal in structure. Aortic valve regurgitation is not visualized. No aortic stenosis is present.  Pulmonic Valve: The pulmonic valve was normal in structure. Pulmonic valve regurgitation is mild. No evidence of pulmonic stenosis.  Aorta: The aortic root is normal in size and structure.  Venous: The inferior vena cava is normal in size with greater than 50% respiratory variability, suggesting right atrial pressure of 3 mmHg.  IAS/Shunts: No atrial level shunt detected by color flow Doppler.   LEFT VENTRICLE PLAX 2D LVIDd:         3.20 cm   Diastology LVIDs:         2.50 cm   LV e' medial:    7.40 cm/s LV PW:         0.60 cm   LV E/e' medial:  16.2 LV IVS:        0.90 cm   LV e' lateral:   9.36 cm/s LVOT diam:     1.90 cm   LV E/e' lateral: 12.8 LV SV:         75 LV SV Index:   39        2D Longitudinal Strain LVOT Area:     2.84 cm  2D Strain GLS (A2C):   -18.2 % 2D Strain GLS (A3C):   -17.7 % 2D Strain GLS (A4C):   -18.1 % 2D Strain GLS Avg:     -18.0 %  3D Volume EF: 3D EF:        66 % LV EDV:        78 ml LV ESV:       27 ml LV SV:        51 ml  RIGHT VENTRICLE          IVC RV Basal diam:  3.30 cm  IVC diam: 1.50 cm RV Mid diam:    2.40 cm  LEFT ATRIUM  Index        RIGHT ATRIUM          Index LA diam:        2.85 cm 1.49 cm/m   RA Area:     8.74 cm LA Vol (A2C):   25.6 ml 13.40 ml/m  RA Volume:   17.40 ml 9.11 ml/m LA Vol (A4C):   23.7 ml 12.41 ml/m LA Biplane Vol: 25.0 ml 13.09 ml/m AORTIC VALVE             PULMONIC VALVE LVOT Vmax:   113.00 cm/s PR End Diast Vel: 5.20 msec LVOT Vmean:  78.000 cm/s LVOT VTI:    0.263 m  AORTA Ao Root diam: 2.80 cm Ao Asc diam:  2.80 cm  MITRAL VALVE MV Area (PHT): 4.89 cm     SHUNTS MV Decel Time: 155 msec     Systemic VTI:  0.26 m MV E velocity: 120.00 cm/s  Systemic Diam: 1.90 cm MV A velocity: 88.00 cm/s MV E/A ratio:  1.36  Toribio Fuel MD Electronically signed by Toribio Fuel MD Signature Date/Time: 09/29/2022/11:49:59 AM    Final      CT SCANS  CT CARDIAC SCORING (SELF PAY ONLY) 09/17/2022  Addendum 09/23/2022  1:45 AM ADDENDUM REPORT: 09/23/2022 01:43  EXAM: OVER-READ INTERPRETATION  CT CHEST  The following report is an over-read performed by radiologist Dr. Oneil Devonshire of [facility] on 09/23/2022. This over-read does not include interpretation of cardiac or coronary anatomy or pathology. The coronary calcium  score interpretation by the cardiologist is attached.  COMPARISON:  None.  FINDINGS: Cardiovascular: There are no significant extracardiac vascular findings.  Mediastinum/Nodes: There are no enlarged lymph nodes within the visualized mediastinum.  Lungs/Pleura: There is no pleural effusion. The visualized lungs appear clear.  Upper abdomen: No significant findings in the visualized upper abdomen.  Musculoskeletal/Chest wall: Postsurgical changes are noted in the thoracolumbar spine consistent with the given clinical history.  IMPRESSION: No significant  extracardiac findings within the visualized chest.   Electronically Signed By: Oneil Devonshire M.D. On: 09/23/2022 01:43  Narrative : Cardiovascular Disease Risk stratification  EXAM: Coronary Calcium  Score  TECHNIQUE: A gated, non-contrast computed tomography scan of the heart was performed using 3mm slice thickness. Axial images were analyzed on a dedicated workstation. Calcium  scoring of the coronary arteries was performed using the Agatston method.  FINDINGS: Coronary arteries: Normal origins.  Coronary Calcium  Score:  Left main: 0  Left anterior descending artery: 47.3  Left circumflex artery: 0  Right coronary artery: 0  Total: 47.3  Percentile: 47  Pericardium: Normal.  Ascending Aorta: Normal caliber.  Non-cardiac: See separate report from [facility].  IMPRESSION: Coronary calcium  score of 47.3. This was 35 percentile for age-, race-, and sex-matched controls.  RECOMMENDATIONS: Coronary artery calcium  (CAC) score is a strong predictor of incident coronary heart disease (CHD) and provides predictive information beyond traditional risk factors. CAC scoring is reasonable to use in the decision to withhold, postpone, or initiate statin therapy in intermediate-risk or selected borderline-risk asymptomatic adults (age 4-75 years and LDL-C >=70 to <190 mg/dL) who do not have diabetes or established atherosclerotic cardiovascular disease (ASCVD).* In intermediate-risk (10-year ASCVD risk >=7.5% to <20%) adults or selected borderline-risk (10-year ASCVD risk >=5% to <7.5%) adults in whom a CAC score is measured for the purpose of making a treatment decision the following recommendations have been made:  If CAC=0, it is reasonable to withhold statin therapy and reassess in 5 to 10  years, as long as higher risk conditions are absent (diabetes mellitus, family history of premature CHD in first degree relatives (males <55 years; females <65 years),  cigarette smoking, or LDL >=190 mg/dL).  If CAC is 1 to 99, it is reasonable to initiate statin therapy for patients >=85 years of age.  If CAC is >=100 or >=75th percentile, it is reasonable to initiate statin therapy at any age.  Cardiology referral should be considered for patients with CAC scores >=400 or >=75th percentile.  *2018 AHA/ACC/AACVPR/AAPA/ABC/ACPM/ADA/AGS/APhA/ASPC/NLA/PCNA Guideline on the Management of Blood Cholesterol: A Report of the American College of Cardiology/American Heart Association Task Force on Clinical Practice Guidelines. J Am Coll Cardiol. 2019;73(24):3168-3209.  Kardie Tobb, DO  The noncardiac portion of this study will be interpreted in separate report by the radiologist.  Electronically Signed: By: Kardie  Tobb D.O. On: 09/17/2022 22:16     ______________________________________________________________________________________________       Physical Exam:    VS:  BP 121/74 (BP Location: Left Arm)   Pulse 67   Ht 5' 6 (1.676 m)   Wt 184 lb (83.5 kg)   SpO2 97%   BMI 29.70 kg/m    Wt Readings from Last 3 Encounters:  10/29/23 184 lb (83.5 kg)  08/31/22 179 lb 9.6 oz (81.5 kg)  06/06/22 178 lb 9.2 oz (81 kg)    Gen: no distress   Neck: No JVD Cardiac: No Rubs or Gallops, systolic murmur Murmur, RRR +2 radial pulses Respiratory: Clear to auscultation bilaterally, normal effort, normal  respiratory rate GI: Soft, nontender, non-distended  MS: No  edema;  moves all extremities Integument: Skin feels warm Neuro:  At time of evaluation, alert and oriented to person/place/time/situation  Psych: Normal affect, patient feels ok   ASSESSMENT AND PLAN: .    Coronary artery calcification Coronary artery calcification with a percentile score of 47%, indicating moderate calcification compared to peers. She is asymptomatic and active. Cholesterol levels from September 2024: total cholesterol 227 mg/dL, HDL 70 mg/dL, LDL 871 mg/dL,  triglycerides 829 mg/dL. Goal: reduce LDL below 70 mg/dL and triglycerides below 150 mg/dL due to calcium  buildup. - Encourage continuation of current exercise regimen, including strength training and cardio. - Advise dietary modifications to further reduce cholesterol levels. - Review cholesterol levels in September 2025. - If cholesterol levels remain elevated, consider starting ezetimibe. - If ezetimibe is ineffective, discuss trial of a different statin or newer medications like PCSK9 inhibitors, pending insurance approval.  Aortic atherosclerosis Aortic atherosclerosis noted on imaging without significant symptoms or evidence of aortic stenosis. Heart murmur present, likely due to mild thickening of the aortic valve, not causing hemodynamic compromise. - LDL goal < 70  Heart murmur Heart murmur detected, likely related to mild thickening of the aortic valve. No current symptoms or evidence of aortic stenosis. Murmur not expected to cause significant problems. - Monitor for any new symptoms that may warrant further investigation.  Statin-induced myopathy Previous statin therapy discontinued due to adverse effects including chest pain, swelling, and disorientation. Symptoms resolved upon discontinuation. - Avoid statin therapy unless other options are exhausted and benefits outweigh risks.  Hypertension Blood pressure is well controlled with current management.  LDL goal < 70 Smoke free lifestyle recommended Discussed strong activity level SDM: PRN f/u   Stanly Leavens, MD FASE [facility] Cardiologist [facility]  [address] [place], [place] [address] [phone]  1:23 PM

## 2023-11-04 ENCOUNTER — Other Ambulatory Visit: Payer: Self-pay | Admitting: Interventional Cardiology

## 2023-11-22 DIAGNOSIS — R002 Palpitations: Secondary | ICD-10-CM | POA: Diagnosis not present

## 2023-11-22 DIAGNOSIS — I129 Hypertensive chronic kidney disease with stage 1 through stage 4 chronic kidney disease, or unspecified chronic kidney disease: Secondary | ICD-10-CM | POA: Diagnosis not present

## 2023-11-22 DIAGNOSIS — R350 Frequency of micturition: Secondary | ICD-10-CM | POA: Diagnosis not present

## 2023-11-22 DIAGNOSIS — F419 Anxiety disorder, unspecified: Secondary | ICD-10-CM | POA: Diagnosis not present

## 2023-12-01 DIAGNOSIS — R1032 Left lower quadrant pain: Secondary | ICD-10-CM | POA: Diagnosis not present

## 2023-12-01 DIAGNOSIS — K5130 Ulcerative (chronic) rectosigmoiditis without complications: Secondary | ICD-10-CM | POA: Diagnosis not present

## 2023-12-07 ENCOUNTER — Other Ambulatory Visit: Payer: Self-pay | Admitting: Internal Medicine

## 2023-12-21 ENCOUNTER — Other Ambulatory Visit: Payer: Self-pay | Admitting: Nurse Practitioner

## 2023-12-21 DIAGNOSIS — R109 Unspecified abdominal pain: Secondary | ICD-10-CM

## 2023-12-22 ENCOUNTER — Encounter: Payer: Self-pay | Admitting: Nurse Practitioner

## 2023-12-27 ENCOUNTER — Ambulatory Visit
Admission: RE | Admit: 2023-12-27 | Discharge: 2023-12-27 | Disposition: A | Discharge status: Home / Self Care | Source: Ambulatory Visit | Attending: Nurse Practitioner | Admitting: Nurse Practitioner

## 2023-12-27 DIAGNOSIS — R109 Unspecified abdominal pain: Secondary | ICD-10-CM

## 2023-12-27 DIAGNOSIS — N200 Calculus of kidney: Secondary | ICD-10-CM | POA: Diagnosis not present

## 2024-01-04 DIAGNOSIS — R7303 Prediabetes: Secondary | ICD-10-CM | POA: Diagnosis not present

## 2024-01-04 DIAGNOSIS — I700 Atherosclerosis of aorta: Secondary | ICD-10-CM | POA: Diagnosis not present

## 2024-01-10 DIAGNOSIS — M8580 Other specified disorders of bone density and structure, unspecified site: Secondary | ICD-10-CM | POA: Diagnosis not present

## 2024-01-10 DIAGNOSIS — H3530 Unspecified macular degeneration: Secondary | ICD-10-CM | POA: Diagnosis not present

## 2024-01-10 DIAGNOSIS — N1831 Chronic kidney disease, stage 3a: Secondary | ICD-10-CM | POA: Diagnosis not present

## 2024-01-10 DIAGNOSIS — H0102B Squamous blepharitis left eye, upper and lower eyelids: Secondary | ICD-10-CM | POA: Diagnosis not present

## 2024-01-10 DIAGNOSIS — H409 Unspecified glaucoma: Secondary | ICD-10-CM | POA: Diagnosis not present

## 2024-01-10 DIAGNOSIS — R7303 Prediabetes: Secondary | ICD-10-CM | POA: Diagnosis not present

## 2024-01-10 DIAGNOSIS — Z0000 Encounter for general adult medical examination without abnormal findings: Secondary | ICD-10-CM | POA: Diagnosis not present

## 2024-01-10 DIAGNOSIS — Z961 Presence of intraocular lens: Secondary | ICD-10-CM | POA: Diagnosis not present

## 2024-01-10 DIAGNOSIS — I129 Hypertensive chronic kidney disease with stage 1 through stage 4 chronic kidney disease, or unspecified chronic kidney disease: Secondary | ICD-10-CM | POA: Diagnosis not present

## 2024-01-10 DIAGNOSIS — H18513 Endothelial corneal dystrophy, bilateral: Secondary | ICD-10-CM | POA: Diagnosis not present

## 2024-01-10 DIAGNOSIS — Z1331 Encounter for screening for depression: Secondary | ICD-10-CM | POA: Diagnosis not present

## 2024-01-10 DIAGNOSIS — H0102A Squamous blepharitis right eye, upper and lower eyelids: Secondary | ICD-10-CM | POA: Diagnosis not present

## 2024-01-10 DIAGNOSIS — H401231 Low-tension glaucoma, bilateral, mild stage: Secondary | ICD-10-CM | POA: Diagnosis not present

## 2024-01-10 DIAGNOSIS — K219 Gastro-esophageal reflux disease without esophagitis: Secondary | ICD-10-CM | POA: Diagnosis not present

## 2024-01-11 ENCOUNTER — Encounter (INDEPENDENT_AMBULATORY_CARE_PROVIDER_SITE_OTHER): Payer: Medicare PPO | Admitting: Ophthalmology

## 2024-01-11 DIAGNOSIS — H43813 Vitreous degeneration, bilateral: Secondary | ICD-10-CM | POA: Diagnosis not present

## 2024-01-11 DIAGNOSIS — I10 Essential (primary) hypertension: Secondary | ICD-10-CM

## 2024-01-11 DIAGNOSIS — H35033 Hypertensive retinopathy, bilateral: Secondary | ICD-10-CM

## 2024-01-11 DIAGNOSIS — H353132 Nonexudative age-related macular degeneration, bilateral, intermediate dry stage: Secondary | ICD-10-CM

## 2024-03-06 ENCOUNTER — Other Ambulatory Visit: Payer: Self-pay | Admitting: Internal Medicine

## 2025-01-16 ENCOUNTER — Encounter (INDEPENDENT_AMBULATORY_CARE_PROVIDER_SITE_OTHER): Admitting: Ophthalmology
